# Patient Record
Sex: Female | Born: 1950 | Race: White | Hispanic: No | Smoking: Current every day smoker
Health system: Southern US, Community
[De-identification: ages and names within clinical notes are randomized; demographics above are authoritative.]

## PROBLEM LIST (undated history)

## (undated) DIAGNOSIS — R945 Abnormal results of liver function studies: Secondary | ICD-10-CM

## (undated) DIAGNOSIS — H3530 Unspecified macular degeneration: Secondary | ICD-10-CM

## (undated) DIAGNOSIS — R112 Nausea with vomiting, unspecified: Secondary | ICD-10-CM

## (undated) DIAGNOSIS — R7989 Other specified abnormal findings of blood chemistry: Secondary | ICD-10-CM

## (undated) DIAGNOSIS — I10 Essential (primary) hypertension: Secondary | ICD-10-CM

## (undated) DIAGNOSIS — Z9889 Other specified postprocedural states: Secondary | ICD-10-CM

## (undated) DIAGNOSIS — E039 Hypothyroidism, unspecified: Secondary | ICD-10-CM

## (undated) DIAGNOSIS — M1990 Unspecified osteoarthritis, unspecified site: Secondary | ICD-10-CM

## (undated) HISTORY — DX: Essential (primary) hypertension: I10

## (undated) HISTORY — PX: OTHER SURGICAL HISTORY: SHX169

## (undated) HISTORY — DX: Other specified abnormal findings of blood chemistry: R79.89

## (undated) HISTORY — DX: Unspecified osteoarthritis, unspecified site: M19.90

## (undated) HISTORY — PX: NECK SURGERY: SHX720

## (undated) HISTORY — PX: JOINT REPLACEMENT: SHX530

## (undated) HISTORY — DX: Unspecified macular degeneration: H35.30

## (undated) HISTORY — DX: Hypothyroidism, unspecified: E03.9

## (undated) HISTORY — DX: Abnormal results of liver function studies: R94.5

---

## 2002-11-06 ENCOUNTER — Encounter: Admission: RE | Admit: 2002-11-06 | Discharge: 2002-11-06 | Payer: Self-pay | Admitting: Rheumatology

## 2002-11-06 ENCOUNTER — Encounter: Payer: Self-pay | Admitting: Rheumatology

## 2003-04-06 ENCOUNTER — Encounter: Admission: RE | Admit: 2003-04-06 | Discharge: 2003-04-06 | Payer: Self-pay | Admitting: Rheumatology

## 2003-04-06 ENCOUNTER — Encounter: Payer: Self-pay | Admitting: Rheumatology

## 2010-12-21 ENCOUNTER — Inpatient Hospital Stay (HOSPITAL_COMMUNITY)
Admission: AD | Admit: 2010-12-21 | Discharge: 2010-12-22 | DRG: 125 | Disposition: A | Discharge status: Home / Self Care | Payer: BC Managed Care – PPO | Source: Other Acute Inpatient Hospital | Attending: Internal Medicine | Admitting: Internal Medicine

## 2010-12-21 DIAGNOSIS — M1990 Unspecified osteoarthritis, unspecified site: Secondary | ICD-10-CM | POA: Diagnosis present

## 2010-12-21 DIAGNOSIS — F17200 Nicotine dependence, unspecified, uncomplicated: Secondary | ICD-10-CM | POA: Diagnosis present

## 2010-12-21 DIAGNOSIS — Z96649 Presence of unspecified artificial hip joint: Secondary | ICD-10-CM

## 2010-12-21 DIAGNOSIS — R0789 Other chest pain: Principal | ICD-10-CM | POA: Diagnosis present

## 2010-12-21 DIAGNOSIS — E039 Hypothyroidism, unspecified: Secondary | ICD-10-CM | POA: Diagnosis present

## 2010-12-21 DIAGNOSIS — I10 Essential (primary) hypertension: Secondary | ICD-10-CM | POA: Diagnosis present

## 2010-12-21 DIAGNOSIS — K7689 Other specified diseases of liver: Secondary | ICD-10-CM | POA: Diagnosis present

## 2010-12-21 DIAGNOSIS — R079 Chest pain, unspecified: Secondary | ICD-10-CM

## 2010-12-21 LAB — CARDIAC PANEL(CRET KIN+CKTOT+MB+TROPI)
Relative Index: INVALID (ref 0.0–2.5)
Total CK: 58 U/L (ref 7–177)
Troponin I: 0.02 ng/mL (ref 0.00–0.06)

## 2010-12-21 LAB — CBC
HCT: 39.3 % (ref 36.0–46.0)
Hemoglobin: 12.9 g/dL (ref 12.0–15.0)
MCV: 92.5 fL (ref 78.0–100.0)
Platelets: 217 10*3/uL (ref 150–400)
RBC: 4.25 MIL/uL (ref 3.87–5.11)
WBC: 7.1 10*3/uL (ref 4.0–10.5)

## 2010-12-21 LAB — COMPREHENSIVE METABOLIC PANEL
ALT: 207 U/L — ABNORMAL HIGH (ref 0–35)
AST: 124 U/L — ABNORMAL HIGH (ref 0–37)
Albumin: 3.4 g/dL — ABNORMAL LOW (ref 3.5–5.2)
Alkaline Phosphatase: 143 U/L — ABNORMAL HIGH (ref 39–117)
Calcium: 9.3 mg/dL (ref 8.4–10.5)
Creatinine, Ser: 0.64 mg/dL (ref 0.4–1.2)
GFR calc Af Amer: >60 mL/min (ref >60)
GFR calc non Af Amer: >60 mL/min (ref >60)
Glucose, Bld: 97 mg/dL (ref 70–99)
Potassium: 3.7 mEq/L (ref 3.5–5.1)
Sodium: 136 mEq/L (ref 135–145)
Total Bilirubin: 0.2 mg/dL — ABNORMAL LOW (ref 0.3–1.2)
Total Protein: 6.5 g/dL (ref 6.0–8.3)

## 2010-12-21 LAB — PROTIME-INR
INR: 1.01 (ref 0.00–1.49)
Prothrombin Time: 13.5 seconds (ref 11.6–15.2)

## 2010-12-22 DIAGNOSIS — R079 Chest pain, unspecified: Secondary | ICD-10-CM

## 2010-12-24 NOTE — Cardiovascular Report (Signed)
NAME:  Krista Brown, Krista Brown NO.:  [id number]  MEDICAL RECORD NO.:  [id number]           PATIENT TYPE:  I  LOCATION:  6522                         FACILITY:  [facility]  PHYSICIAN:  Verne Carrow, MDDATE OF BIRTH:  July 16, 1951  DATE OF PROCEDURE:  12/22/2010 DATE OF DISCHARGE:  12/22/2010                           CARDIAC CATHETERIZATION   PRIMARY CARDIOLOGIST:  Jonelle Sidle, MD  PROCEDURES PERFORMED: 1. Left heart catheterization. 2. Selective coronary angiography. 3. Left ventricular angiogram.  OPERATOR:  Verne Carrow, MD  INDICATIONS:  This is a 60 year old Caucasian female with a history of tobacco abuse and family history of premature coronary artery disease who presented to [facility] with complaints of chest discomfort. Her cardiac enzymes were negative.  Her EKG showed nonspecific changes. She was transferred to [facility] for cardiac catheterization today.  DETAILS OF PROCEDURE:  The patient was brought to the Main Cardiac Catheterization Laboratory after signing informed consent for the procedure.  The right wrist was assessed with an [name] test which was positive.  The right wrist was prepped and draped in a sterile fashion. Lidocaine 1% was used for local anesthesia.  A 5-French sheath was inserted into the right radial artery without difficulty.  Verapamil 3 mg was given through the sheath after insertion.  The patient was given 3500 units of intravenous heparin after sheath insertion.  Standard diagnostic catheters were used to perform selective coronary angiography.  A pigtail catheter was used to perform a left ventricular angiogram.  The patient tolerated the procedure well and was taken to the recovery room in stable condition.  Of note, the sheath was removed here in the Cath Lab and a Terumo hemostasis band was applied over the arteriotomy site.  FINDINGS:  Central aortic pressure 153/83.  Left ventricular  pressure 148/14/19.  ANGIOGRAPHIC FINDINGS: 1. The native right coronary artery was a large dominant vessel with     no evidence of disease. 2. The left main artery was a normal vessel with no evidence of     disease. 3. There was moderate to large sized ramus intermediate branch with no     evidence of disease. 4. Circumflex artery gave off 2 very small-caliber obtuse marginal     branches and 2 moderate-sized obtuse marginal branches.  There was     no disease in this system. 5. The left anterior descending was a large vessel that coursed to the     apex and gave off 2 small-caliber diagonal branches.  There was no     disease in this system. 6. Left ventricular angiogram was performed in the RAO projection and     showed normal left ventricular systolic function with ejection     fraction of 60%.  IMPRESSION: 1. No angiographic evidence of coronary artery disease. 2. Normal left ventricular systolic function.  RECOMMENDATIONS:  No further cardiac workup at this time.  The patient will be watched closely for 3 hours and then will be discharged to home today.  Followup will be planned with Dr. Diona Browner in our [place] office. Tobacco cessation is strongly encouraged.  Verne Carrow, MD     CM/MEDQ  D:  12/22/2010  T:  12/22/2010  Job:  [id number]  cc:   Jonelle Sidle, MD  Electronically Signed by Verne Carrow MD on 12/24/2010 02:11:20 PM

## 2010-12-24 NOTE — Discharge Summary (Addendum)
NAME:  Krista Brown, Krista Brown NO.:  [id number]  MEDICAL RECORD NO.:  [id number]           PATIENT TYPE:  I  LOCATION:  6522                         FACILITY:  [facility]  PHYSICIAN:  Verne Carrow, MDDATE OF BIRTH:  02/11/1951  DATE OF ADMISSION:  12/21/2010 DATE OF DISCHARGE:  12/22/2010                              DISCHARGE SUMMARY   DISCHARGE DIAGNOSES: 1. Chest pain.     a.     Elevated D-dimer 0.9, negative pulmonary embolus by CT      angiogram.     b.     Normal coronary arteries by catheterization December 22, 2010,      with normal left ventricular function.     c.     Negative cardiac enzymes. 2. Hypertension, initiated on Norvasc. 3. Osteoarthritis. 4. Hypothyroidism, euthyroid with TSH 1.25. 5. Bilateral cataract surgery. 6. Neck surgery. 7. Right hip replacement in 2005. 8. Bilateral tubal ligation. 9. Transaminitis with alkaline phos 143, AST 124, ALT 207, for     outpatient followup.     a.     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  HOSPITAL COURSE:  Krista Brown is a very pleasant 60 year old female with a history of tobacco abuse, as well as diagnosed hypertension here in hospital.  Krista Brown has no prior cardiac history of diabetes or hyperlipidemia.  Krista Brown presented to [facility] with complaints of floating, which ultimately culminated into feeling of chest tightness, causing her to lose sleep.  Krista Brown had a feeling of orthopnea as well with the symptoms lasting in weaning.  Krista Brown was treated with nitro, oxygen, morphine ultimately with improvement in her symptoms and total relief. Krista Brown had a D-dimer of 0.9 at [facility].  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  Krista Brown was admitted for further observation.  Cardiac markers were normal with a pH level 0.3.  EKG showed sinus rhythm with left apical enlargement and low  voltage in limb leads with subsequent tracing showing poor anterior R-wave progression, and ST-T changes and T-wave inversions in the precordial leads.  The patient was initially seen and assessed by Dr. Diona Browner himself.  Krista Brown will need a cardiac catheterization to further define her anatomy.  Krista Brown was transferred to [facility] and underwent this procedure on December 22, 2010, by Dr. Clifton James who found her to have normal coronary arteries as well as normal LV function.  Please note here in the hospital Krista Brown also had elevated LFTs with alk phos of 143, AST 124, ALT of 207.  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  I discussed in depth this with her, and Krista Brown agrees to follow up with her primary care provider as an outpatient.  Dr. Clifton James has seen and examined her today and feels Krista Brown is stable for discharge.  DISCHARGE LABORATORY DATA:  LFTs as above.  Please see [place] results. WBC 7.1, hemoglobin 12.9, hematocrit 39.3, platelet count 217.  Sodium  136, potassium 2.7, chloride 100, CO2 of 31, glucose 97, BUN 7, creatinine 0.64.  Cardiac enzymes negative x1 year.  STUDIES:  Cardiac catheterization on December 22, 2010, please see full report for details.  No evidence of CAD.  EF 60%.  DISCHARGE MEDICATIONS: 1. Norvasc 5 mg daily.  This is a new medication for the patient.     After discussion with Dr. Clifton James, we have elected to start her on     this medicine for blood pressures ranging from mid upper 130s to     150s. 2. Faslodex OTC 1 capsule daily. 3. Calcium carbonate 1 tablet daily. 4. Synthroid 88 mcg every morning. 5. Vitamin B complex 1 tablet daily. 6. Vitamin C 1 tablet daily. 7. Vitamin D3 1 capsule daily.  Her Lovaza has been stopped until her LFT issue is clarified.  DISPOSITION:  Krista Brown will be discharged in stable condition to home. Krista Brown may return to work on Dec 25, 2010, and is not to lift  anything over 5 pounds for 1 weeks, participate in sexual activities for 1 week, or drive for 2 days.  Krista Brown is to follow a heart-healthy diet and to call or return if Krista Brown notices any pain, swelling, bleeding, or pus at her cath site.  Krista Brown will follow up with Dr. Diona Browner on Jan 08, 2011, at 8:40 a.m. for post cath check/posthospital visit.  Krista Brown is also instructed to follow up with PCP within 1 week plus her elevated liver function tests in the setting of hepatic cyst on CT angio.  Krista Brown is to stop Lovaza until Krista Brown sees her primary care doctor.  DURATION OF DISCHARGE ENCOUNTER:  Greater than 30 minutes including physician and PA time.     Ronie Spies, P.A.C.   ______________________________ Verne Carrow, MD    DD/MEDQ  D:  12/22/2010  T:  12/23/2010  Job:  [id number]  cc:   Jonelle Sidle, MD  Electronically Signed by Verne Carrow MD on 12/24/2010 02:11:22 PM Electronically Signed by Ronie Spies  on 12/30/2010 01:08:37 PM

## 2011-01-06 ENCOUNTER — Encounter: Payer: Self-pay | Admitting: *Deleted

## 2011-01-06 ENCOUNTER — Encounter: Payer: Self-pay | Admitting: Cardiology

## 2011-01-07 ENCOUNTER — Encounter: Payer: Self-pay | Admitting: Cardiology

## 2011-01-08 ENCOUNTER — Encounter: Payer: Self-pay | Admitting: Cardiology

## 2011-01-08 ENCOUNTER — Ambulatory Visit (INDEPENDENT_AMBULATORY_CARE_PROVIDER_SITE_OTHER): Payer: BC Managed Care – PPO | Admitting: Cardiology

## 2011-01-08 DIAGNOSIS — R079 Chest pain, unspecified: Secondary | ICD-10-CM

## 2011-01-08 DIAGNOSIS — K759 Inflammatory liver disease, unspecified: Secondary | ICD-10-CM

## 2011-01-08 DIAGNOSIS — I10 Essential (primary) hypertension: Secondary | ICD-10-CM

## 2011-01-08 NOTE — Assessment & Plan Note (Signed)
Recently placed on Norvasc. Continued monitoring and management, per Dr. Neita Carp.

## 2011-01-08 NOTE — Assessment & Plan Note (Signed)
Patient to followup with Dr. Neita Carp, for continued monitoring and management of recent elevated LFTs, on Omega-3 fatty acids. This apparently had been prescribed, approximately one year ago, for treatment of macular degeneration, by her ophthalmologist. Krista Brown has been placed on hold, and decision to resume will be deferred to Dr. Neita Carp, pending review of followup blood work.

## 2011-01-08 NOTE — Assessment & Plan Note (Signed)
No further cardiac workup is indicated, following recent normal coronary angiogram. Patient may return to Dr. Diona Browner, on an as needed basis.

## 2011-01-08 NOTE — Progress Notes (Signed)
Clinical Summary Krista Brown is a 60 y.o.female, who now presents for post catheterization followup.  Patient was initially seen here in the [facility] ED, by Dr. Diona Browner, per Dr. Dian Situ request, for evaluation of chest pain worrisome for unstable angina pectoris.  Recommendation was to transfer directly for coronary angiography, which was performed later that same day. Prior to transfer, patient had a CT angiogram of the chest, following an abnormal d-dimer, which was negative for pulmonary embolus.  A catheterization yielded normal coronary arteries and left ventricular function. Of note, she was found to have elevated liver function tests, and was advised to stop taking Lovaza. She has since been seen in followup by Dr. Neita Carp, and has remote history of hepatitis.   From a cardiac standpoint, she denies any recurrent CP, since her initial treatment here in the ED. She also reports no complications of her right wrist incision site.  No Known Allergies  Current outpatient prescriptions:amLODipine (NORVASC) 5 MG tablet, Take 5 mg by mouth daily.  , Disp: , Rfl: ;  b complex vitamins tablet, Take 1 tablet by mouth daily.  , Disp: , Rfl: ;  Bioflavonoid Products (BIOFLEX PO), Take 1 tablet by mouth daily.  , Disp: , Rfl: ;  Calcium Carbonate-Vitamin D (CALTRATE 600+D) 600-400 MG-UNIT per tablet, Take 1 tablet by mouth daily.  , Disp: , Rfl:  Cholecalciferol (D3 ADULT PO), Take 1 capsule by mouth 2 (two) times daily. , Disp: , Rfl: ;  fish oil-omega-3 fatty acids 1000 MG capsule, Take 1 g by mouth 2 (two) times daily.  , Disp: , Rfl: ;  levothyroxine (SYNTHROID, LEVOTHROID) 88 MCG tablet, Take 88 mcg by mouth daily.  , Disp: , Rfl: ;  Multiple Vitamins-Minerals (EYE VITAMINS PO), Take 1 capsule by mouth daily.  , Disp: , Rfl:  vitamin C (ASCORBIC ACID) 500 MG tablet, Take 500 mg by mouth daily.  , Disp: , Rfl:   Past Medical History  Diagnosis Date  . Essential hypertension, benign   . Hypothyroidism     . Osteoarthritis   . Abnormal liver function tests     Hepatic cyst  . Chest pain     Normal coronary arteries 4/12  . Macular degeneration     Social History Krista Brown reports that she has been smoking Cigarettes.  She has a 20 pack-year smoking history. She has never used smokeless tobacco. Krista Brown reports that she drinks alcohol.  Review of Systems  The patient denies fatigue, malaise, fever, weight gain/loss, vision loss, decreased hearing, hoarseness, chest pain, palpitations, shortness of breath, prolonged cough, wheezing, sleep apnea, coughing up blood, abdominal pain, blood in stool, nausea, vomiting, diarrhea, heartburn, incontinence, blood in urine, muscle weakness, joint pain, leg swelling, rash, skin lesions, headache, fainting, dizziness, depression, anxiety, enlarged lymph nodes, easy bruising or bleeding, and environmental allergies.      Physical Examination Filed Vitals:   01/08/11 0837  BP: 131/82  Pulse: 76   General: Well-developed, well-nourished in no distress head: Normocephalic and atraumatic eyes PERRLA/EOMI intact, conjunctiva and lids normal nose: No deformity or lesions mouth normal dentition, normal posterior pharynx neck: Supple, no JVD.  No masses, thyromegaly or abnormal cervical nodes lungs: Normal breath sounds bilaterally without wheezing.  Normal percussion heart: regular rate and rhythm with normal S1 and S2, no S3 or S4.  PMI is normal.  No pathological murmurs abdomen: Normal bowel sounds, abdomen is soft and nontender without masses, organomegaly or hernias noted.  No hepatosplenomegaly musculoskeletal: Back  normal, normal gait muscle strength and tone normal pulsus: Pulse is normal in all 4 extremities Extremities: Stable right wrist incision site; palpable RP, with no hematoma, ecchymosis; no peripheral pitting edema neurologic: Alert and oriented x 3 skin: Intact without lesions or rashes cervical nodes: No significant  adenopathy psychologic: Normal affect   ECG   Studies   Problem List and Plan

## 2016-08-03 DIAGNOSIS — E039 Hypothyroidism, unspecified: Secondary | ICD-10-CM | POA: Diagnosis not present

## 2016-08-03 DIAGNOSIS — I10 Essential (primary) hypertension: Secondary | ICD-10-CM | POA: Diagnosis not present

## 2016-08-03 DIAGNOSIS — R69 Illness, unspecified: Secondary | ICD-10-CM | POA: Diagnosis not present

## 2016-08-03 DIAGNOSIS — E7800 Pure hypercholesterolemia, unspecified: Secondary | ICD-10-CM | POA: Diagnosis not present

## 2016-08-03 DIAGNOSIS — M19041 Primary osteoarthritis, right hand: Secondary | ICD-10-CM | POA: Diagnosis not present

## 2016-08-06 DIAGNOSIS — M1612 Unilateral primary osteoarthritis, left hip: Secondary | ICD-10-CM | POA: Diagnosis not present

## 2016-08-06 DIAGNOSIS — I10 Essential (primary) hypertension: Secondary | ICD-10-CM | POA: Diagnosis not present

## 2016-08-06 DIAGNOSIS — R69 Illness, unspecified: Secondary | ICD-10-CM | POA: Diagnosis not present

## 2016-08-06 DIAGNOSIS — M19041 Primary osteoarthritis, right hand: Secondary | ICD-10-CM | POA: Diagnosis not present

## 2016-08-06 DIAGNOSIS — E039 Hypothyroidism, unspecified: Secondary | ICD-10-CM | POA: Diagnosis not present

## 2016-09-15 DIAGNOSIS — H52223 Regular astigmatism, bilateral: Secondary | ICD-10-CM | POA: Diagnosis not present

## 2016-09-15 DIAGNOSIS — H353131 Nonexudative age-related macular degeneration, bilateral, early dry stage: Secondary | ICD-10-CM | POA: Diagnosis not present

## 2016-11-14 DIAGNOSIS — I10 Essential (primary) hypertension: Secondary | ICD-10-CM | POA: Diagnosis not present

## 2016-11-14 DIAGNOSIS — M1990 Unspecified osteoarthritis, unspecified site: Secondary | ICD-10-CM | POA: Diagnosis not present

## 2016-11-14 DIAGNOSIS — Z79899 Other long term (current) drug therapy: Secondary | ICD-10-CM | POA: Diagnosis not present

## 2016-11-14 DIAGNOSIS — M5431 Sciatica, right side: Secondary | ICD-10-CM | POA: Diagnosis not present

## 2016-11-14 DIAGNOSIS — Z96641 Presence of right artificial hip joint: Secondary | ICD-10-CM | POA: Diagnosis not present

## 2016-11-14 DIAGNOSIS — M25551 Pain in right hip: Secondary | ICD-10-CM | POA: Diagnosis not present

## 2016-12-16 DIAGNOSIS — R69 Illness, unspecified: Secondary | ICD-10-CM | POA: Diagnosis not present

## 2017-01-29 DIAGNOSIS — R69 Illness, unspecified: Secondary | ICD-10-CM | POA: Diagnosis not present

## 2017-01-29 DIAGNOSIS — I10 Essential (primary) hypertension: Secondary | ICD-10-CM | POA: Diagnosis not present

## 2017-01-29 DIAGNOSIS — E7800 Pure hypercholesterolemia, unspecified: Secondary | ICD-10-CM | POA: Diagnosis not present

## 2017-01-29 DIAGNOSIS — E039 Hypothyroidism, unspecified: Secondary | ICD-10-CM | POA: Diagnosis not present

## 2017-02-02 DIAGNOSIS — E039 Hypothyroidism, unspecified: Secondary | ICD-10-CM | POA: Diagnosis not present

## 2017-02-02 DIAGNOSIS — M1612 Unilateral primary osteoarthritis, left hip: Secondary | ICD-10-CM | POA: Diagnosis not present

## 2017-02-02 DIAGNOSIS — E7800 Pure hypercholesterolemia, unspecified: Secondary | ICD-10-CM | POA: Diagnosis not present

## 2017-02-02 DIAGNOSIS — F411 Generalized anxiety disorder: Secondary | ICD-10-CM | POA: Diagnosis not present

## 2017-02-02 DIAGNOSIS — Z1389 Encounter for screening for other disorder: Secondary | ICD-10-CM | POA: Diagnosis not present

## 2017-02-02 DIAGNOSIS — M19041 Primary osteoarthritis, right hand: Secondary | ICD-10-CM | POA: Diagnosis not present

## 2017-02-02 DIAGNOSIS — I10 Essential (primary) hypertension: Secondary | ICD-10-CM | POA: Diagnosis not present

## 2017-02-02 DIAGNOSIS — R69 Illness, unspecified: Secondary | ICD-10-CM | POA: Diagnosis not present

## 2017-03-17 DIAGNOSIS — Z1231 Encounter for screening mammogram for malignant neoplasm of breast: Secondary | ICD-10-CM | POA: Diagnosis not present

## 2017-04-06 DIAGNOSIS — Z6827 Body mass index (BMI) 27.0-27.9, adult: Secondary | ICD-10-CM | POA: Diagnosis not present

## 2017-04-06 DIAGNOSIS — Z01419 Encounter for gynecological examination (general) (routine) without abnormal findings: Secondary | ICD-10-CM | POA: Diagnosis not present

## 2017-07-30 DIAGNOSIS — R69 Illness, unspecified: Secondary | ICD-10-CM | POA: Diagnosis not present

## 2017-07-30 DIAGNOSIS — E039 Hypothyroidism, unspecified: Secondary | ICD-10-CM | POA: Diagnosis not present

## 2017-07-30 DIAGNOSIS — I10 Essential (primary) hypertension: Secondary | ICD-10-CM | POA: Diagnosis not present

## 2017-07-30 DIAGNOSIS — E7800 Pure hypercholesterolemia, unspecified: Secondary | ICD-10-CM | POA: Diagnosis not present

## 2017-08-11 DIAGNOSIS — I10 Essential (primary) hypertension: Secondary | ICD-10-CM | POA: Diagnosis not present

## 2017-08-11 DIAGNOSIS — R69 Illness, unspecified: Secondary | ICD-10-CM | POA: Diagnosis not present

## 2017-08-11 DIAGNOSIS — M19041 Primary osteoarthritis, right hand: Secondary | ICD-10-CM | POA: Diagnosis not present

## 2017-08-11 DIAGNOSIS — Z23 Encounter for immunization: Secondary | ICD-10-CM | POA: Diagnosis not present

## 2017-08-11 DIAGNOSIS — E039 Hypothyroidism, unspecified: Secondary | ICD-10-CM | POA: Diagnosis not present

## 2017-08-11 DIAGNOSIS — E7800 Pure hypercholesterolemia, unspecified: Secondary | ICD-10-CM | POA: Diagnosis not present

## 2017-08-11 DIAGNOSIS — M1612 Unilateral primary osteoarthritis, left hip: Secondary | ICD-10-CM | POA: Diagnosis not present

## 2017-08-11 DIAGNOSIS — Z0000 Encounter for general adult medical examination without abnormal findings: Secondary | ICD-10-CM | POA: Diagnosis not present

## 2017-11-01 DIAGNOSIS — I10 Essential (primary) hypertension: Secondary | ICD-10-CM | POA: Diagnosis not present

## 2017-11-01 DIAGNOSIS — Z0100 Encounter for examination of eyes and vision without abnormal findings: Secondary | ICD-10-CM | POA: Diagnosis not present

## 2018-02-04 DIAGNOSIS — I10 Essential (primary) hypertension: Secondary | ICD-10-CM | POA: Diagnosis not present

## 2018-02-04 DIAGNOSIS — E039 Hypothyroidism, unspecified: Secondary | ICD-10-CM | POA: Diagnosis not present

## 2018-02-04 DIAGNOSIS — R69 Illness, unspecified: Secondary | ICD-10-CM | POA: Diagnosis not present

## 2018-02-04 DIAGNOSIS — M19041 Primary osteoarthritis, right hand: Secondary | ICD-10-CM | POA: Diagnosis not present

## 2018-02-04 DIAGNOSIS — E7800 Pure hypercholesterolemia, unspecified: Secondary | ICD-10-CM | POA: Diagnosis not present

## 2018-02-07 DIAGNOSIS — E7800 Pure hypercholesterolemia, unspecified: Secondary | ICD-10-CM | POA: Diagnosis not present

## 2018-02-07 DIAGNOSIS — M1612 Unilateral primary osteoarthritis, left hip: Secondary | ICD-10-CM | POA: Diagnosis not present

## 2018-02-07 DIAGNOSIS — M19041 Primary osteoarthritis, right hand: Secondary | ICD-10-CM | POA: Diagnosis not present

## 2018-02-07 DIAGNOSIS — E039 Hypothyroidism, unspecified: Secondary | ICD-10-CM | POA: Diagnosis not present

## 2018-02-07 DIAGNOSIS — Z6826 Body mass index (BMI) 26.0-26.9, adult: Secondary | ICD-10-CM | POA: Diagnosis not present

## 2018-02-07 DIAGNOSIS — I10 Essential (primary) hypertension: Secondary | ICD-10-CM | POA: Diagnosis not present

## 2018-02-07 DIAGNOSIS — J069 Acute upper respiratory infection, unspecified: Secondary | ICD-10-CM | POA: Diagnosis not present

## 2018-07-05 DIAGNOSIS — M25552 Pain in left hip: Secondary | ICD-10-CM | POA: Diagnosis not present

## 2018-07-05 DIAGNOSIS — E663 Overweight: Secondary | ICD-10-CM | POA: Diagnosis not present

## 2018-07-05 DIAGNOSIS — Z299 Encounter for prophylactic measures, unspecified: Secondary | ICD-10-CM | POA: Diagnosis not present

## 2018-07-05 DIAGNOSIS — M1612 Unilateral primary osteoarthritis, left hip: Secondary | ICD-10-CM | POA: Diagnosis not present

## 2018-07-05 DIAGNOSIS — M461 Sacroiliitis, not elsewhere classified: Secondary | ICD-10-CM | POA: Diagnosis not present

## 2018-07-05 DIAGNOSIS — M170 Bilateral primary osteoarthritis of knee: Secondary | ICD-10-CM | POA: Diagnosis not present

## 2018-07-14 DIAGNOSIS — M1612 Unilateral primary osteoarthritis, left hip: Secondary | ICD-10-CM | POA: Diagnosis not present

## 2018-07-14 DIAGNOSIS — M25562 Pain in left knee: Secondary | ICD-10-CM | POA: Diagnosis not present

## 2018-07-14 DIAGNOSIS — M25561 Pain in right knee: Secondary | ICD-10-CM | POA: Diagnosis not present

## 2018-07-14 DIAGNOSIS — M25552 Pain in left hip: Secondary | ICD-10-CM | POA: Diagnosis not present

## 2018-07-26 DIAGNOSIS — E7800 Pure hypercholesterolemia, unspecified: Secondary | ICD-10-CM | POA: Diagnosis not present

## 2018-07-26 DIAGNOSIS — R69 Illness, unspecified: Secondary | ICD-10-CM | POA: Diagnosis not present

## 2018-07-26 DIAGNOSIS — E039 Hypothyroidism, unspecified: Secondary | ICD-10-CM | POA: Diagnosis not present

## 2018-07-26 DIAGNOSIS — I10 Essential (primary) hypertension: Secondary | ICD-10-CM | POA: Diagnosis not present

## 2018-07-29 DIAGNOSIS — Z6826 Body mass index (BMI) 26.0-26.9, adult: Secondary | ICD-10-CM | POA: Diagnosis not present

## 2018-07-29 DIAGNOSIS — J069 Acute upper respiratory infection, unspecified: Secondary | ICD-10-CM | POA: Diagnosis not present

## 2018-07-29 DIAGNOSIS — E782 Mixed hyperlipidemia: Secondary | ICD-10-CM | POA: Diagnosis not present

## 2018-07-29 DIAGNOSIS — Z23 Encounter for immunization: Secondary | ICD-10-CM | POA: Diagnosis not present

## 2018-07-29 DIAGNOSIS — E7800 Pure hypercholesterolemia, unspecified: Secondary | ICD-10-CM | POA: Diagnosis not present

## 2018-07-29 DIAGNOSIS — E039 Hypothyroidism, unspecified: Secondary | ICD-10-CM | POA: Diagnosis not present

## 2018-07-29 DIAGNOSIS — I10 Essential (primary) hypertension: Secondary | ICD-10-CM | POA: Diagnosis not present

## 2018-10-07 DIAGNOSIS — M1612 Unilateral primary osteoarthritis, left hip: Secondary | ICD-10-CM | POA: Diagnosis not present

## 2018-10-07 DIAGNOSIS — M170 Bilateral primary osteoarthritis of knee: Secondary | ICD-10-CM | POA: Diagnosis not present

## 2018-10-07 DIAGNOSIS — Z6824 Body mass index (BMI) 24.0-24.9, adult: Secondary | ICD-10-CM | POA: Diagnosis not present

## 2018-10-07 DIAGNOSIS — I10 Essential (primary) hypertension: Secondary | ICD-10-CM | POA: Diagnosis not present

## 2018-10-07 DIAGNOSIS — M25552 Pain in left hip: Secondary | ICD-10-CM | POA: Diagnosis not present

## 2018-10-07 DIAGNOSIS — Z299 Encounter for prophylactic measures, unspecified: Secondary | ICD-10-CM | POA: Diagnosis not present

## 2018-10-07 DIAGNOSIS — R269 Unspecified abnormalities of gait and mobility: Secondary | ICD-10-CM | POA: Diagnosis not present

## 2018-10-11 DIAGNOSIS — M25652 Stiffness of left hip, not elsewhere classified: Secondary | ICD-10-CM | POA: Diagnosis not present

## 2018-10-11 DIAGNOSIS — M1612 Unilateral primary osteoarthritis, left hip: Secondary | ICD-10-CM | POA: Diagnosis not present

## 2018-10-11 DIAGNOSIS — R531 Weakness: Secondary | ICD-10-CM | POA: Diagnosis not present

## 2018-10-11 DIAGNOSIS — M25552 Pain in left hip: Secondary | ICD-10-CM | POA: Diagnosis not present

## 2018-10-12 DIAGNOSIS — M25652 Stiffness of left hip, not elsewhere classified: Secondary | ICD-10-CM | POA: Diagnosis not present

## 2018-10-12 DIAGNOSIS — M1612 Unilateral primary osteoarthritis, left hip: Secondary | ICD-10-CM | POA: Diagnosis not present

## 2018-10-12 DIAGNOSIS — M25552 Pain in left hip: Secondary | ICD-10-CM | POA: Diagnosis not present

## 2018-10-12 DIAGNOSIS — R531 Weakness: Secondary | ICD-10-CM | POA: Diagnosis not present

## 2018-10-17 DIAGNOSIS — M25552 Pain in left hip: Secondary | ICD-10-CM | POA: Diagnosis not present

## 2018-10-17 DIAGNOSIS — M25652 Stiffness of left hip, not elsewhere classified: Secondary | ICD-10-CM | POA: Diagnosis not present

## 2018-10-17 DIAGNOSIS — R531 Weakness: Secondary | ICD-10-CM | POA: Diagnosis not present

## 2018-10-17 DIAGNOSIS — M1612 Unilateral primary osteoarthritis, left hip: Secondary | ICD-10-CM | POA: Diagnosis not present

## 2018-10-19 DIAGNOSIS — R531 Weakness: Secondary | ICD-10-CM | POA: Diagnosis not present

## 2018-10-19 DIAGNOSIS — M1612 Unilateral primary osteoarthritis, left hip: Secondary | ICD-10-CM | POA: Diagnosis not present

## 2018-10-19 DIAGNOSIS — M25552 Pain in left hip: Secondary | ICD-10-CM | POA: Diagnosis not present

## 2018-10-19 DIAGNOSIS — M25652 Stiffness of left hip, not elsewhere classified: Secondary | ICD-10-CM | POA: Diagnosis not present

## 2018-10-21 DIAGNOSIS — M1612 Unilateral primary osteoarthritis, left hip: Secondary | ICD-10-CM | POA: Diagnosis not present

## 2018-10-25 DIAGNOSIS — M25552 Pain in left hip: Secondary | ICD-10-CM | POA: Diagnosis not present

## 2018-10-25 DIAGNOSIS — M25652 Stiffness of left hip, not elsewhere classified: Secondary | ICD-10-CM | POA: Diagnosis not present

## 2018-10-25 DIAGNOSIS — M1612 Unilateral primary osteoarthritis, left hip: Secondary | ICD-10-CM | POA: Diagnosis not present

## 2018-10-25 DIAGNOSIS — R531 Weakness: Secondary | ICD-10-CM | POA: Diagnosis not present

## 2018-10-27 DIAGNOSIS — M25552 Pain in left hip: Secondary | ICD-10-CM | POA: Diagnosis not present

## 2018-10-27 DIAGNOSIS — M1612 Unilateral primary osteoarthritis, left hip: Secondary | ICD-10-CM | POA: Diagnosis not present

## 2018-10-27 DIAGNOSIS — M25652 Stiffness of left hip, not elsewhere classified: Secondary | ICD-10-CM | POA: Diagnosis not present

## 2018-10-27 DIAGNOSIS — R531 Weakness: Secondary | ICD-10-CM | POA: Diagnosis not present

## 2018-10-31 DIAGNOSIS — M1612 Unilateral primary osteoarthritis, left hip: Secondary | ICD-10-CM | POA: Diagnosis not present

## 2018-10-31 DIAGNOSIS — M25652 Stiffness of left hip, not elsewhere classified: Secondary | ICD-10-CM | POA: Diagnosis not present

## 2018-10-31 DIAGNOSIS — M25552 Pain in left hip: Secondary | ICD-10-CM | POA: Diagnosis not present

## 2018-10-31 DIAGNOSIS — R531 Weakness: Secondary | ICD-10-CM | POA: Diagnosis not present

## 2018-11-03 DIAGNOSIS — M1612 Unilateral primary osteoarthritis, left hip: Secondary | ICD-10-CM | POA: Diagnosis not present

## 2018-11-03 DIAGNOSIS — M25552 Pain in left hip: Secondary | ICD-10-CM | POA: Diagnosis not present

## 2018-11-03 DIAGNOSIS — R531 Weakness: Secondary | ICD-10-CM | POA: Diagnosis not present

## 2018-11-03 DIAGNOSIS — M25652 Stiffness of left hip, not elsewhere classified: Secondary | ICD-10-CM | POA: Diagnosis not present

## 2018-11-10 DIAGNOSIS — M25652 Stiffness of left hip, not elsewhere classified: Secondary | ICD-10-CM | POA: Diagnosis not present

## 2018-11-10 DIAGNOSIS — M1612 Unilateral primary osteoarthritis, left hip: Secondary | ICD-10-CM | POA: Diagnosis not present

## 2018-11-10 DIAGNOSIS — R531 Weakness: Secondary | ICD-10-CM | POA: Diagnosis not present

## 2018-11-10 DIAGNOSIS — M25552 Pain in left hip: Secondary | ICD-10-CM | POA: Diagnosis not present

## 2018-11-14 DIAGNOSIS — R531 Weakness: Secondary | ICD-10-CM | POA: Diagnosis not present

## 2018-11-14 DIAGNOSIS — M1612 Unilateral primary osteoarthritis, left hip: Secondary | ICD-10-CM | POA: Diagnosis not present

## 2018-11-14 DIAGNOSIS — M25652 Stiffness of left hip, not elsewhere classified: Secondary | ICD-10-CM | POA: Diagnosis not present

## 2018-11-14 DIAGNOSIS — M25552 Pain in left hip: Secondary | ICD-10-CM | POA: Diagnosis not present

## 2018-11-17 DIAGNOSIS — M25552 Pain in left hip: Secondary | ICD-10-CM | POA: Diagnosis not present

## 2018-11-17 DIAGNOSIS — R531 Weakness: Secondary | ICD-10-CM | POA: Diagnosis not present

## 2018-11-17 DIAGNOSIS — M1612 Unilateral primary osteoarthritis, left hip: Secondary | ICD-10-CM | POA: Diagnosis not present

## 2018-11-17 DIAGNOSIS — M25652 Stiffness of left hip, not elsewhere classified: Secondary | ICD-10-CM | POA: Diagnosis not present

## 2018-11-23 DIAGNOSIS — M25552 Pain in left hip: Secondary | ICD-10-CM | POA: Diagnosis not present

## 2018-11-23 DIAGNOSIS — M1612 Unilateral primary osteoarthritis, left hip: Secondary | ICD-10-CM | POA: Diagnosis not present

## 2018-11-23 DIAGNOSIS — M25652 Stiffness of left hip, not elsewhere classified: Secondary | ICD-10-CM | POA: Diagnosis not present

## 2018-11-23 DIAGNOSIS — R531 Weakness: Secondary | ICD-10-CM | POA: Diagnosis not present

## 2018-12-08 DIAGNOSIS — M25552 Pain in left hip: Secondary | ICD-10-CM | POA: Diagnosis not present

## 2018-12-08 DIAGNOSIS — M1612 Unilateral primary osteoarthritis, left hip: Secondary | ICD-10-CM | POA: Diagnosis not present

## 2018-12-08 DIAGNOSIS — R531 Weakness: Secondary | ICD-10-CM | POA: Diagnosis not present

## 2018-12-08 DIAGNOSIS — M25652 Stiffness of left hip, not elsewhere classified: Secondary | ICD-10-CM | POA: Diagnosis not present

## 2018-12-21 DIAGNOSIS — M1612 Unilateral primary osteoarthritis, left hip: Secondary | ICD-10-CM | POA: Diagnosis not present

## 2018-12-21 DIAGNOSIS — M25652 Stiffness of left hip, not elsewhere classified: Secondary | ICD-10-CM | POA: Diagnosis not present

## 2018-12-21 DIAGNOSIS — R531 Weakness: Secondary | ICD-10-CM | POA: Diagnosis not present

## 2018-12-21 DIAGNOSIS — M25552 Pain in left hip: Secondary | ICD-10-CM | POA: Diagnosis not present

## 2019-01-23 DIAGNOSIS — E782 Mixed hyperlipidemia: Secondary | ICD-10-CM | POA: Diagnosis not present

## 2019-01-23 DIAGNOSIS — E7800 Pure hypercholesterolemia, unspecified: Secondary | ICD-10-CM | POA: Diagnosis not present

## 2019-01-23 DIAGNOSIS — I10 Essential (primary) hypertension: Secondary | ICD-10-CM | POA: Diagnosis not present

## 2019-01-23 DIAGNOSIS — R69 Illness, unspecified: Secondary | ICD-10-CM | POA: Diagnosis not present

## 2019-01-23 DIAGNOSIS — M19041 Primary osteoarthritis, right hand: Secondary | ICD-10-CM | POA: Diagnosis not present

## 2019-01-23 DIAGNOSIS — E039 Hypothyroidism, unspecified: Secondary | ICD-10-CM | POA: Diagnosis not present

## 2019-01-26 DIAGNOSIS — E039 Hypothyroidism, unspecified: Secondary | ICD-10-CM | POA: Diagnosis not present

## 2019-01-26 DIAGNOSIS — E7800 Pure hypercholesterolemia, unspecified: Secondary | ICD-10-CM | POA: Diagnosis not present

## 2019-01-26 DIAGNOSIS — R69 Illness, unspecified: Secondary | ICD-10-CM | POA: Diagnosis not present

## 2019-01-26 DIAGNOSIS — M19041 Primary osteoarthritis, right hand: Secondary | ICD-10-CM | POA: Diagnosis not present

## 2019-01-26 DIAGNOSIS — I10 Essential (primary) hypertension: Secondary | ICD-10-CM | POA: Diagnosis not present

## 2019-01-26 DIAGNOSIS — Z6825 Body mass index (BMI) 25.0-25.9, adult: Secondary | ICD-10-CM | POA: Diagnosis not present

## 2019-01-26 DIAGNOSIS — E782 Mixed hyperlipidemia: Secondary | ICD-10-CM | POA: Diagnosis not present

## 2019-02-10 DIAGNOSIS — M1612 Unilateral primary osteoarthritis, left hip: Secondary | ICD-10-CM | POA: Diagnosis not present

## 2019-02-17 DIAGNOSIS — M169 Osteoarthritis of hip, unspecified: Secondary | ICD-10-CM | POA: Diagnosis not present

## 2019-02-17 DIAGNOSIS — I10 Essential (primary) hypertension: Secondary | ICD-10-CM | POA: Diagnosis not present

## 2019-02-17 DIAGNOSIS — E039 Hypothyroidism, unspecified: Secondary | ICD-10-CM | POA: Diagnosis not present

## 2019-02-17 DIAGNOSIS — Z6825 Body mass index (BMI) 25.0-25.9, adult: Secondary | ICD-10-CM | POA: Diagnosis not present

## 2019-03-07 ENCOUNTER — Encounter: Payer: Self-pay | Admitting: *Deleted

## 2019-03-08 ENCOUNTER — Encounter: Payer: Self-pay | Admitting: *Deleted

## 2019-03-08 ENCOUNTER — Ambulatory Visit (INDEPENDENT_AMBULATORY_CARE_PROVIDER_SITE_OTHER): Payer: Medicare HMO | Admitting: Cardiology

## 2019-03-08 ENCOUNTER — Other Ambulatory Visit: Payer: Self-pay

## 2019-03-08 ENCOUNTER — Telehealth: Payer: Self-pay | Admitting: Cardiovascular Disease

## 2019-03-08 ENCOUNTER — Encounter: Payer: Self-pay | Admitting: Cardiology

## 2019-03-08 VITALS — BP 132/70 | HR 74 | Temp 97.5°F | Ht 59.0 in | Wt 128.2 lb

## 2019-03-08 DIAGNOSIS — Z87898 Personal history of other specified conditions: Secondary | ICD-10-CM | POA: Diagnosis not present

## 2019-03-08 DIAGNOSIS — I10 Essential (primary) hypertension: Secondary | ICD-10-CM

## 2019-03-08 DIAGNOSIS — Z01818 Encounter for other preprocedural examination: Secondary | ICD-10-CM

## 2019-03-08 NOTE — Patient Instructions (Signed)
Your physician recommends that you schedule a follow-up appointment in: AS NEEDED WITH DR BRANCH  Your physician recommends that you continue on your current medications as directed. Please refer to the Current Medication list given to you today.  Thank you for choosing [facility]!!    

## 2019-03-08 NOTE — Progress Notes (Signed)
Clinical Summary Ms. Conyer is a 68 y.o.female seen as new patient for preoperative evaluation   1. History of chest pain - admit 12/2010 with chest pain and abnormal EKG. CT PE was negative.  - she had a cath 11/2010 with normal coronaries  -no recent chest pain - no SOB or DOE.  - does housework regularly (sweeping,laundry) without cardiopulmonary symptoms. Can be limited by her chronic hip pain - can walk up a flight of stairs with assistance of railing.  - stationary bike without troubles.     2. HTN - compliant with meds  3. Preoperative evaluation - being considered for hip replacement.       SH: mother is Kennon Portela, also patient of mine Past Medical History:  Diagnosis Date  . Abnormal liver function tests    Hepatic cyst  . Chest pain    Normal coronary arteries 4/12  . Essential hypertension, benign   . Hypothyroidism   . Macular degeneration   . Osteoarthritis      No Known Allergies   Current Outpatient Medications  Medication Sig Dispense Refill  . amLODipine (NORVASC) 5 MG tablet Take 5 mg by mouth daily.      Marland Kitchen b complex vitamins tablet Take 1 tablet by mouth daily.      Marland Kitchen Bioflavonoid Products (BIOFLEX PO) Take 1 tablet by mouth daily.      . Calcium Carbonate-Vitamin D (CALTRATE 600+D) 600-400 MG-UNIT per tablet Take 1 tablet by mouth daily.      . Cholecalciferol (D3 ADULT PO) Take 1 capsule by mouth 2 (two) times daily.     . fish oil-omega-3 fatty acids 1000 MG capsule Take 1 g by mouth 2 (two) times daily.      Marland Kitchen levothyroxine (SYNTHROID, LEVOTHROID) 88 MCG tablet Take 88 mcg by mouth daily.      . Multiple Vitamins-Minerals (EYE VITAMINS PO) Take 1 capsule by mouth daily.      . vitamin C (ASCORBIC ACID) 500 MG tablet Take 500 mg by mouth daily.       No current facility-administered medications for this visit.        No Known Allergies    Family History  Problem Relation Age of Onset  . Stroke Father        Age 26  .  Hypertension Mother   . Diabetes type II Mother      Social History Ms. Reppucci reports that she has been smoking cigarettes. She has a 20.00 pack-year smoking history. She has never used smokeless tobacco. Ms. Zappia reports current alcohol use.   Review of Systems CONSTITUTIONAL: No weight loss, fever, chills, weakness or fatigue.  HEENT: Eyes: No visual loss, blurred vision, double vision or yellow sclerae.No hearing loss, sneezing, congestion, runny nose or sore throat.  SKIN: No rash or itching.  CARDIOVASCULAR: per hpi RESPIRATORY: No shortness of breath, cough or sputum.  GASTROINTESTINAL: No anorexia, nausea, vomiting or diarrhea. No abdominal pain or blood.  GENITOURINARY: No burning on urination, no polyuria NEUROLOGICAL: No headache, dizziness, syncope, paralysis, ataxia, numbness or tingling in the extremities. No change in bowel or bladder control.  MUSCULOSKELETAL: left hip pain LYMPHATICS: No enlarged nodes. No history of splenectomy.  PSYCHIATRIC: No history of depression or anxiety.  ENDOCRINOLOGIC: No reports of sweating, cold or heat intolerance. No polyuria or polydipsia.  Marland Kitchen   Physical Examination Today's Vitals   03/08/19 1023  BP: 132/70  Pulse: 74  Temp: (!) 97.5 F (36.4  C)  SpO2: 97%  Weight: 128 lb 3.2 oz (58.2 kg)  Height: 4\' 11"  (1.499 m)   Body mass index is 25.89 kg/m.  Gen: resting comfortably, no acute distress HEENT: no scleral icterus, pupils equal round and reactive, no palptable cervical adenopathy,  CV: RRR, no m/rg, no vjd Resp: Clear to auscultation bilaterally GI: abdomen is soft, non-tender, non-distended, normal bowel sounds, no hepatosplenomegaly MSK: extremities are warm, no edema.  Skin: warm, no rash Neuro:  no focal deficits Psych: appropriate affect   Diagnostic Studies 11/2010 cath FINDINGS:  Central aortic pressure 153/83.  Left ventricular pressure 148/14/19.  ANGIOGRAPHIC FINDINGS: 1. The native right coronary  artery was a large dominant vessel with     no evidence of disease. 2. The left main artery was a normal vessel with no evidence of     disease. 3. There was moderate to large sized ramus intermediate Desa Rech with no     evidence of disease. 4. Circumflex artery gave off 2 very small-caliber obtuse marginal     branches and 2 moderate-sized obtuse marginal branches.  There was     no disease in this system. 5. The left anterior descending was a large vessel that coursed to the     apex and gave off 2 small-caliber diagonal branches.  There was no     disease in this system. 6. Left ventricular angiogram was performed in the RAO projection and     showed normal left ventricular systolic function with ejection     fraction of 60%.  POST CATH IMPRESSION: 1. No angiographic evidence of coronary artery disease. 2. Normal left ventricular systolic function.  RECOMMENDATIONS:  No further cardiac workup at this time.  The patient will be watched closely for 3 hours and then will be discharged to home today.  Followup will be planned with Dr. Diona BrownerMcDowell in our [place] office. Tobacco cessation is strongly encouraged.    Assessment and Plan  1. History of chest pain - no recent symptoms. Prior cath 2012 without significant disease - continue to monitor - EKG today without acute ischemic changes  2. HTN - at goal, continue current meds  3. Preoperative evaluation - limited by hip but essentially tolerates greater than 4METs without significant cardiopulmonary symptoms - recommend proceeding with surgery as planned.     F/u as needed   Antoine PocheJonathan F. Elasha Tess, M.D.

## 2019-03-08 NOTE — Telephone Encounter (Signed)

## 2019-03-23 ENCOUNTER — Ambulatory Visit: Payer: Self-pay | Admitting: Physician Assistant

## 2019-03-23 DIAGNOSIS — M1612 Unilateral primary osteoarthritis, left hip: Secondary | ICD-10-CM | POA: Diagnosis not present

## 2019-03-23 NOTE — H&P (View-Only) (Signed)
TOTAL HIP ADMISSION H&P  Patient is admitted for left total hip arthroplasty.  Subjective:  Chief Complaint: left hip pain  HPI: Krista Brown, 68 y.o. female, has a history of pain and functional disability in the left hip(s) due to arthritis and patient has failed non-surgical conservative treatments for greater than 12 weeks to include NSAID's and/or analgesics, corticosteriod injections, use of assistive devices and activity modification.  Onset of symptoms was gradual starting 8 years ago with gradually worsening course since that time.The patient noted no past surgery on the left hip(s).  Patient currently rates pain in the left hip at 8 out of 10 with activity. Patient has night pain, worsening of pain with activity and weight bearing, trendelenberg gait, pain that interfers with activities of daily living and pain with passive range of motion. Patient has evidence of subchondral cysts, periarticular osteophytes and joint space narrowing by imaging studies. This condition presents safety issues increasing the risk of falls.  There is no current active infection.  Patient Active Problem List   Diagnosis Date Noted  . Chest pain 01/08/2011  . Unspecified essential hypertension 01/08/2011  . Hepatitis 01/08/2011   Past Medical History:  Diagnosis Date  . Abnormal liver function tests    Hepatic cyst  . Chest pain    Normal coronary arteries 4/12  . Essential hypertension, benign   . Hypothyroidism   . Macular degeneration   . Osteoarthritis       Current Outpatient Medications  Medication Sig Dispense Refill Last Dose  . acetaminophen (TYLENOL) 500 MG tablet Take 1,500 mg by mouth 2 (two) times daily as needed for moderate pain or headache.     Marland Kitchen amLODipine (NORVASC) 10 MG tablet Take 10 mg by mouth daily.   Taking  . Cholecalciferol (VITAMIN D3) 25 MCG (1000 UT) CAPS Take 1,000 Units by mouth daily.     . clonazePAM (KLONOPIN) 0.5 MG tablet Take 0.5 mg by mouth every 8 (eight)  hours as needed for anxiety.    Taking  . Coenzyme Q10 100 MG capsule Take 100 mg by mouth daily.    Taking  . fish oil-omega-3 fatty acids 1000 MG capsule Take 1 g by mouth 2 (two) times daily.     Taking  . levothyroxine (SYNTHROID) 75 MCG tablet Take 75 mcg by mouth daily before breakfast.    Taking  . Multiple Vitamins-Minerals (PRESERVISION AREDS 2) CAPS Take 1 capsule by mouth 2 (two) times a day.     . Turmeric 500 MG TABS Take 750 mg by mouth daily.     . vitamin C (ASCORBIC ACID) 500 MG tablet Take 500 mg by mouth daily.     Taking   No current facility-administered medications for this visit.    No Known Allergies  Social History   Tobacco Use  . Smoking status: Current Every Day Smoker    Packs/day: 0.50    Years: 40.00    Pack years: 20.00    Types: Cigarettes  . Smokeless tobacco: Never Used  . Tobacco comment: 3 ciggs per day, trying to cut back   Substance Use Topics  . Alcohol use: Yes    Comment: 1-2 glasses of red wine in the evening    Family History  Problem Relation Age of Onset  . Stroke Father        Age 40  . Hypertension Mother   . Diabetes type II Mother      Review of Systems  Constitutional: Positive  for weight loss.  Gastrointestinal: Positive for constipation.  Genitourinary: Positive for frequency and urgency.  Musculoskeletal: Positive for joint pain.  Psychiatric/Behavioral: The patient is nervous/anxious.   All other systems reviewed and are negative.   Objective:  Physical Exam  Constitutional: She is oriented to person, place, and time. She appears well-developed and well-nourished. No distress.  HENT:  Head: Normocephalic and atraumatic.  Nose: Nose normal.  Eyes: Pupils are equal, round, and reactive to light. Conjunctivae and EOM are normal.  Neck: Normal range of motion. Neck supple.  Cardiovascular: Normal rate, regular rhythm, normal heart sounds and intact distal pulses.  Respiratory: Effort normal and breath sounds  normal. No respiratory distress. She has no wheezes.  GI: Soft. Bowel sounds are normal. She exhibits no distension. There is no abdominal tenderness.  Musculoskeletal:     Left hip: She exhibits decreased range of motion, tenderness and bony tenderness.  Lymphadenopathy:    She has no cervical adenopathy.  Neurological: She is alert and oriented to person, place, and time.  Skin: Skin is warm and dry. No rash noted. No erythema.  Psychiatric: She has a normal mood and affect. Her behavior is normal.    Vital signs in last 24 hours: @VSRANGES @  Labs:   Estimated body mass index is 25.89 kg/m as calculated from the following:   Height as of 03/08/19: 4\' 11"  (1.499 m).   Weight as of 03/08/19: 58.2 kg.   Imaging Review Plain radiographs demonstrate severe degenerative joint disease of the left hip(s). The bone quality appears to be good for age and reported activity level.      Assessment/Plan:  End stage arthritis, left hip(s)  The patient history, physical examination, clinical judgement of the provider and imaging studies are consistent with end stage degenerative joint disease of the left hip(s) and total hip arthroplasty is deemed medically necessary. The treatment options including medical management, injection therapy, arthroscopy and arthroplasty were discussed at length. The risks and benefits of total hip arthroplasty were presented and reviewed. The risks due to aseptic loosening, infection, stiffness, dislocation/subluxation,  thromboembolic complications and other imponderables were discussed.  The patient acknowledged the explanation, agreed to proceed with the plan and consent was signed. Patient is being admitted for inpatient treatment for surgery, pain control, PT, OT, prophylactic antibiotics, VTE prophylaxis, progressive ambulation and ADL's and discharge planning.The patient is planning to be discharged home with outpatient PT referral   Anticipated LOS equal to  or greater than 2 midnights due to - Age 68 and older with one or more of the following:  - Obesity  - Expected need for hospital services (PT, OT, Nursing) required for safe  discharge  - Anticipated need for postoperative skilled nursing care or inpatient rehab  - Active co-morbidities: None OR   - Unanticipated findings during/Post Surgery: None  - Patient is a high risk of re-admission due to: None

## 2019-03-23 NOTE — H&P (Signed)
TOTAL HIP ADMISSION H&P  Patient is admitted for left total hip arthroplasty.  Subjective:  Chief Complaint: left hip pain  HPI: Krista Brown, 68 y.o. female, has a history of pain and functional disability in the left hip(s) due to arthritis and patient has failed non-surgical conservative treatments for greater than 12 weeks to include NSAID's and/or analgesics, corticosteriod injections, use of assistive devices and activity modification.  Onset of symptoms was gradual starting 8 years ago with gradually worsening course since that time.The patient noted no past surgery on the left hip(s).  Patient currently rates pain in the left hip at 8 out of 10 with activity. Patient has night pain, worsening of pain with activity and weight bearing, trendelenberg gait, pain that interfers with activities of daily living and pain with passive range of motion. Patient has evidence of subchondral cysts, periarticular osteophytes and joint space narrowing by imaging studies. This condition presents safety issues increasing the risk of falls.  There is no current active infection.  Patient Active Problem List   Diagnosis Date Noted  . Chest pain 01/08/2011  . Unspecified essential hypertension 01/08/2011  . Hepatitis 01/08/2011   Past Medical History:  Diagnosis Date  . Abnormal liver function tests    Hepatic cyst  . Chest pain    Normal coronary arteries 4/12  . Essential hypertension, benign   . Hypothyroidism   . Macular degeneration   . Osteoarthritis       Current Outpatient Medications  Medication Sig Dispense Refill Last Dose  . acetaminophen (TYLENOL) 500 MG tablet Take 1,500 mg by mouth 2 (two) times daily as needed for moderate pain or headache.     Marland Kitchen amLODipine (NORVASC) 10 MG tablet Take 10 mg by mouth daily.   Taking  . Cholecalciferol (VITAMIN D3) 25 MCG (1000 UT) CAPS Take 1,000 Units by mouth daily.     . clonazePAM (KLONOPIN) 0.5 MG tablet Take 0.5 mg by mouth every 8 (eight)  hours as needed for anxiety.    Taking  . Coenzyme Q10 100 MG capsule Take 100 mg by mouth daily.    Taking  . fish oil-omega-3 fatty acids 1000 MG capsule Take 1 g by mouth 2 (two) times daily.     Taking  . levothyroxine (SYNTHROID) 75 MCG tablet Take 75 mcg by mouth daily before breakfast.    Taking  . Multiple Vitamins-Minerals (PRESERVISION AREDS 2) CAPS Take 1 capsule by mouth 2 (two) times a day.     . Turmeric 500 MG TABS Take 750 mg by mouth daily.     . vitamin C (ASCORBIC ACID) 500 MG tablet Take 500 mg by mouth daily.     Taking   No current facility-administered medications for this visit.    No Known Allergies  Social History   Tobacco Use  . Smoking status: Current Every Day Smoker    Packs/day: 0.50    Years: 40.00    Pack years: 20.00    Types: Cigarettes  . Smokeless tobacco: Never Used  . Tobacco comment: 3 ciggs per day, trying to cut back   Substance Use Topics  . Alcohol use: Yes    Comment: 1-2 glasses of red wine in the evening    Family History  Problem Relation Age of Onset  . Stroke Father        Age 40  . Hypertension Mother   . Diabetes type II Mother      Review of Systems  Constitutional: Positive  for weight loss.  Gastrointestinal: Positive for constipation.  Genitourinary: Positive for frequency and urgency.  Musculoskeletal: Positive for joint pain.  Psychiatric/Behavioral: The patient is nervous/anxious.   All other systems reviewed and are negative.   Objective:  Physical Exam  Constitutional: She is oriented to person, place, and time. She appears well-developed and well-nourished. No distress.  HENT:  Head: Normocephalic and atraumatic.  Nose: Nose normal.  Eyes: Pupils are equal, round, and reactive to light. Conjunctivae and EOM are normal.  Neck: Normal range of motion. Neck supple.  Cardiovascular: Normal rate, regular rhythm, normal heart sounds and intact distal pulses.  Respiratory: Effort normal and breath sounds  normal. No respiratory distress. She has no wheezes.  GI: Soft. Bowel sounds are normal. She exhibits no distension. There is no abdominal tenderness.  Musculoskeletal:     Left hip: She exhibits decreased range of motion, tenderness and bony tenderness.  Lymphadenopathy:    She has no cervical adenopathy.  Neurological: She is alert and oriented to person, place, and time.  Skin: Skin is warm and dry. No rash noted. No erythema.  Psychiatric: She has a normal mood and affect. Her behavior is normal.    Vital signs in last 24 hours: @VSRANGES@  Labs:   Estimated body mass index is 25.89 kg/m as calculated from the following:   Height as of 03/08/19: 4' 11" (1.499 m).   Weight as of 03/08/19: 58.2 kg.   Imaging Review Plain radiographs demonstrate severe degenerative joint disease of the left hip(s). The bone quality appears to be good for age and reported activity level.      Assessment/Plan:  End stage arthritis, left hip(s)  The patient history, physical examination, clinical judgement of the provider and imaging studies are consistent with end stage degenerative joint disease of the left hip(s) and total hip arthroplasty is deemed medically necessary. The treatment options including medical management, injection therapy, arthroscopy and arthroplasty were discussed at length. The risks and benefits of total hip arthroplasty were presented and reviewed. The risks due to aseptic loosening, infection, stiffness, dislocation/subluxation,  thromboembolic complications and other imponderables were discussed.  The patient acknowledged the explanation, agreed to proceed with the plan and consent was signed. Patient is being admitted for inpatient treatment for surgery, pain control, PT, OT, prophylactic antibiotics, VTE prophylaxis, progressive ambulation and ADL's and discharge planning.The patient is planning to be discharged home with outpatient PT referral   Anticipated LOS equal to  or greater than 2 midnights due to - Age 65 and older with one or more of the following:  - Obesity  - Expected need for hospital services (PT, OT, Nursing) required for safe  discharge  - Anticipated need for postoperative skilled nursing care or inpatient rehab  - Active co-morbidities: None OR   - Unanticipated findings during/Post Surgery: None  - Patient is a high risk of re-admission due to: None  

## 2019-03-24 ENCOUNTER — Ambulatory Visit: Payer: Self-pay | Admitting: Cardiology

## 2019-03-27 ENCOUNTER — Encounter (HOSPITAL_COMMUNITY)
Admission: RE | Admit: 2019-03-27 | Discharge: 2019-03-27 | Disposition: A | Discharge status: Home / Self Care | Payer: Medicare HMO | Source: Ambulatory Visit | Attending: Orthopedic Surgery | Admitting: Orthopedic Surgery

## 2019-03-27 ENCOUNTER — Other Ambulatory Visit: Payer: Self-pay

## 2019-03-27 ENCOUNTER — Encounter (HOSPITAL_COMMUNITY): Payer: Self-pay

## 2019-03-27 DIAGNOSIS — Z01812 Encounter for preprocedural laboratory examination: Secondary | ICD-10-CM | POA: Insufficient documentation

## 2019-03-27 DIAGNOSIS — M1612 Unilateral primary osteoarthritis, left hip: Secondary | ICD-10-CM | POA: Insufficient documentation

## 2019-03-27 HISTORY — DX: Other specified postprocedural states: R11.2

## 2019-03-27 HISTORY — DX: Other specified postprocedural states: Z98.890

## 2019-03-27 LAB — COMPREHENSIVE METABOLIC PANEL
ALT: 19 U/L (ref 0–44)
AST: 21 U/L (ref 15–41)
Albumin: 3.9 g/dL (ref 3.5–5.0)
Alkaline Phosphatase: 84 U/L (ref 38–126)
Anion gap: 6 (ref 5–15)
BUN: 14 mg/dL (ref 8–23)
CO2: 28 mmol/L (ref 22–32)
Calcium: 10.2 mg/dL (ref 8.9–10.3)
Chloride: 106 mmol/L (ref 98–111)
Creatinine, Ser: 0.59 mg/dL (ref 0.44–1.00)
GFR calc Af Amer: >60 mL/min (ref >60)
GFR calc non Af Amer: >60 mL/min (ref >60)
Glucose, Bld: 91 mg/dL (ref 70–99)
Potassium: 4.2 mmol/L (ref 3.5–5.1)
Sodium: 140 mmol/L (ref 135–145)
Total Bilirubin: 0.5 mg/dL (ref 0.3–1.2)
Total Protein: 6.9 g/dL (ref 6.5–8.1)

## 2019-03-27 LAB — SURGICAL PCR SCREEN
MRSA, PCR: NEGATIVE
Staphylococcus aureus: POSITIVE — AB

## 2019-03-27 LAB — CBC WITH DIFFERENTIAL/PLATELET
Abs Immature Granulocytes: 0.02 10*3/uL (ref 0.00–0.07)
Basophils Absolute: 0 10*3/uL (ref 0.0–0.1)
Basophils Relative: 1 %
Eosinophils Absolute: 0.1 10*3/uL (ref 0.0–0.5)
Eosinophils Relative: 1 %
HCT: 40 % (ref 36.0–46.0)
Hemoglobin: 12.6 g/dL (ref 12.0–15.0)
Immature Granulocytes: 0 %
Lymphocytes Relative: 29 %
Lymphs Abs: 2.5 10*3/uL (ref 0.7–4.0)
MCH: 30.4 pg (ref 26.0–34.0)
MCHC: 31.5 g/dL (ref 30.0–36.0)
MCV: 96.4 fL (ref 80.0–100.0)
Monocytes Absolute: 0.7 10*3/uL (ref 0.1–1.0)
Monocytes Relative: 8 %
Neutro Abs: 5.1 10*3/uL (ref 1.7–7.7)
Neutrophils Relative %: 61 %
Platelets: 286 10*3/uL (ref 150–400)
RBC: 4.15 MIL/uL (ref 3.87–5.11)
RDW: 15.6 % — ABNORMAL HIGH (ref 11.5–15.5)
WBC: 8.4 10*3/uL (ref 4.0–10.5)
nRBC: 0 % (ref 0.0–0.2)

## 2019-03-27 LAB — URINALYSIS, ROUTINE W REFLEX MICROSCOPIC
Bilirubin Urine: NEGATIVE
Glucose, UA: NEGATIVE mg/dL
Hgb urine dipstick: NEGATIVE
Ketones, ur: NEGATIVE mg/dL
Leukocytes,Ua: NEGATIVE
Nitrite: NEGATIVE
Protein, ur: NEGATIVE mg/dL
Specific Gravity, Urine: 1.006 (ref 1.005–1.030)
pH: 6 (ref 5.0–8.0)

## 2019-03-27 LAB — TYPE AND SCREEN
ABO/RH(D): A POS
Antibody Screen: NEGATIVE

## 2019-03-27 LAB — PROTIME-INR
INR: 0.9 (ref 0.8–1.2)
Prothrombin Time: 12.3 seconds (ref 11.4–15.2)

## 2019-03-27 LAB — ABO/RH: ABO/RH(D): A POS

## 2019-03-27 LAB — APTT: aPTT: 32 seconds (ref 24–36)

## 2019-03-27 NOTE — Patient Instructions (Addendum)
YOU NEED TO HAVE A COVID 19 TEST ON__Tuesday 8/11_____ @_2 :00 PM______, THIS TEST MUST BE DONE BEFORE SURGERY, COME  [address], [place] , [phone]. ONCE YOUR COVID TEST IS COMPLETED, PLEASE BEGIN THE QUARANTINE INSTRUCTIONS AS OUTLINED IN YOUR HANDOUT.                Lauraine Rinneoni C Kagan   Your procedure is scheduled on: Friday 04/07/19    [facility] Main  Entr[facility]  Report to admitting at 7:45 AM   1 VISITOR IS ALLOWED TO WAIT IN WAITING ROOM  ONLY DAY OF YOUR SURGERY.    Call this number if you have problems the morning of surgery [phone]    Remember: Do not eat food or drink liquids :After Midnight             BRUSH YOUR TEETH MORNING OF SURGERY AND RINSE YOUR MOUTH OUT, NO CHEWING GUM CANDY OR MINTS.    Do not eat food After Midnight.  YOU MAY HAVE CLEAR LIQUIDS FROM MIDNIGHT UNTIL 4:30AM. At 7:00 AM  Please finish the prescribed Pre-Surgery  Drink.  Nothing by mouth after you finish the  drink !   [place] - Preparing for Surgery Before surgery, you can play an important role.   Because skin is not sterile, your skin needs to be as free of germs as possible .  You can reduce the number of germs on your skin by washing with CHG (chlorahexidine gluconate) soap before surgery.   CHG is an antiseptic cleaner which kills germs and bonds with the skin to continue killing germs even after washing. Please DO NOT use if you have an allergy to CHG or antibacterial soaps .  If your skin becomes reddened/irritated stop using the CHG and inform your nurse when you arrive at Short Stay. Do not shave (including legs and underarms) for at least 48 hours prior to the first CHG shower Please follow these instructions carefully:   1.  Shower with CHG Soap the night before surgery and the  morning of Surgery.  2.  If you choose to wash your hair, wash your hair first as usual with your  normal  shampoo.  3.  After you shampoo, rinse your hair and body thoroughly  to remove the  shampoo.                                        4.  Use CHG as you would any other liquid soap.  You can apply chg directly  to the skin and wash                       Gently with a scrungie or clean washcloth.  5.  Apply the CHG Soap to your body ONLY FROM THE NECK DOWN.   Do not use on face/ open                           Wound or open sores. Avoid contact with eyes, ears mouth and genitals (private parts).                       Wash face,  Genitals (private parts) with your normal soap.             6.  Wash thoroughly,  paying special attention to the area where your surgery  will be performed.  7.  Thoroughly rinse your body with warm water from the neck down.  8.  DO NOT shower/wash with your normal soap after using and rinsing off  the CHG Soap.                9.  Pat yourself dry with a clean towel.            10.  Wear clean pajamas.            11.  Place clean sheets on your bed the night of your first shower and do not  sleep with pets. Day of Surgery : Do not apply any lotions/deodorants the morning of surgery.  Please wear clean clothes to the hospital/surgery center.  FAILURE TO FOLLOW THESE INSTRUCTIONS MAY RESULT IN THE CANCELLATION OF YOUR SURGERY PATIENT SIGNATURE_________________________________  NURSE SIGNATURE__________________________________  ________________________________________________________________________   Adam Phenix  An incentive spirometer is a tool that can help keep your lungs clear and active. This tool measures how well you are filling your lungs with each breath. Taking long deep breaths may help reverse or decrease the chance of developing breathing (pulmonary) problems (especially infection) following:  A long period of time when you are unable to move or be active. BEFORE THE PROCEDURE   If the spirometer includes an indicator to show your best effort, your nurse or respiratory therapist will set it to a desired  goal.  If possible, sit up straight or lean slightly forward. Try not to slouch.  Hold the incentive spirometer in an upright position. INSTRUCTIONS FOR USE  1. Sit on the edge of your bed if possible, or sit up as far as you can in bed or on a chair. 2. Hold the incentive spirometer in an upright position. 3. Breathe out normally. 4. Place the mouthpiece in your mouth and seal your lips tightly around it. 5. Breathe in slowly and as deeply as possible, raising the piston or the ball toward the top of the column. 6. Hold your breath for 3-5 seconds or for as long as possible. Allow the piston or ball to fall to the bottom of the column. 7. Remove the mouthpiece from your mouth and breathe out normally. 8. Rest for a few seconds and repeat Steps 1 through 7 at least 10 times every 1-2 hours when you are awake. Take your time and take a few normal breaths between deep breaths. 9. The spirometer may include an indicator to show your best effort. Use the indicator as a goal to work toward during each repetition. 10. After each set of 10 deep breaths, practice coughing to be sure your lungs are clear. If you have an incision (the cut made at the time of surgery), support your incision when coughing by placing a pillow or rolled up towels firmly against it. Once you are able to get out of bed, walk around indoors and cough well. You may stop using the incentive spirometer when instructed by your caregiver.  RISKS AND COMPLICATIONS  Take your time so you do not get dizzy or light-headed.  If you are in pain, you may need to take or ask for pain medication before doing incentive spirometry. It is harder to take a deep breath if you are having pain. AFTER USE  Rest and breathe slowly and easily.  It can be helpful to keep track of a log of your progress. Your caregiver can  provide you with a simple table to help with this. If you are using the spirometer at home, follow these instructions: SEEK  MEDICAL CARE IF:   You are having difficultly using the spirometer.  You have trouble using the spirometer as often as instructed.  Your pain medication is not giving enough relief while using the spirometer.  You develop fever of 100.5 F (38.1 C) or higher. SEEK IMMEDIATE MEDICAL CARE IF:   You cough up bloody sputum that had not been present before.  You develop fever of 102 F (38.9 C) or greater.  You develop worsening pain at or near the incision site. MAKE SURE YOU:   Understand these instructions.  Will watch your condition.  Will get help right away if you are not doing well or get worse. Document Released: 12/21/2006 Document Revised: 11/02/2011 Document Reviewed: 02/21/2007 Exit[employer] Patient Information 2014 [employer], [place].   ________________________________________________________________________    Take these medicines the morning of surgery with A SIP OF WATER:  DO NOT TAKE ANY DIABETIC MEDICATIONS DAY OF YOUR SURGERY                               You may not have any metal on your body including hair pins and              piercings  Do not wear jewelry, make-up, lotions, powders or perfumes, deodorant             Do not wear nail polish.  Do not shave  48 hours prior to surgery.              Men may shave face and neck.   Do not bring valuables to the hospital. [facility] IS NOT             RESPONSIBLE   FOR VALUABLES.  Contacts, dentures or bridgework may not be worn into surgery.  Leave suitcase in the car. After surgery it may be brought to your room.     Patients discharged the day of surgery will not be allowed to drive home. IF YOU ARE HAVING SURGERY AND GOING HOME THE SAME DAY, YOU MUST HAVE AN ADULT TO DRIVE YOU HOME AND BE WITH YOU FOR 24 HOURS. YOU MAY GO HOME BY TAXI OR UBER OR ORTHERWISE, BUT AN ADULT MUST ACCOMPANY YOU HOME AND STAY WITH YOU FOR 24 HOURS.  Name and phone number of your driver:  Special Instructions: N/A               Please read over the following fact sheets you were given: _____________________________________________________________________

## 2019-03-28 LAB — URINE CULTURE: Culture: NO GROWTH

## 2019-03-28 NOTE — Anesthesia Preprocedure Evaluation (Addendum)
Anesthesia Evaluation  Patient identified by MRN, date of birth, ID band Patient awake    Reviewed: Allergy & Precautions, NPO status , Patient's Chart, lab work & pertinent test results  History of Anesthesia Complications (+) PONV  Airway Mallampati: II  TM Distance: >3 FB Neck ROM: Full    Dental  (+) Teeth Intact, Dental Advisory Given, Caps,    Pulmonary Current Smoker,    breath sounds clear to auscultation       Cardiovascular hypertension, Pt. on medications  Rhythm:Regular Rate:Normal     Neuro/Psych    GI/Hepatic negative GI ROS, (+) Hepatitis -  Endo/Other  Hypothyroidism   Renal/GU      Musculoskeletal   Abdominal   Peds  Hematology   Anesthesia Other Findings   Reproductive/Obstetrics                          Anesthesia Physical Anesthesia Plan  ASA: III  Anesthesia Plan: General   Post-op Pain Management:    Induction:   PONV Risk Score and Plan: 3 and Ondansetron, Dexamethasone and Midazolam  Airway Management Planned: Oral ETT  Additional Equipment:   Intra-op Plan:   Post-operative Plan: Extubation in OR  Informed Consent: I have reviewed the patients History and Physical, chart, labs and discussed the procedure including the risks, benefits and alternatives for the proposed anesthesia with the patient or authorized representative who has indicated his/her understanding and acceptance.       Plan Discussed with: CRNA and Anesthesiologist  Anesthesia Plan Comments: (Per cardiologist, Dr. Harl Bowie, "Preoperative evaluation - limited by hip but essentially tolerates greater than 4METs without significant cardiopulmonary symptoms - recommend proceeding with surgery as planned.")       Anesthesia Quick Evaluation

## 2019-04-04 ENCOUNTER — Other Ambulatory Visit (HOSPITAL_COMMUNITY)
Admission: RE | Admit: 2019-04-04 | Discharge: 2019-04-04 | Disposition: A | Discharge status: Home / Self Care | Payer: Medicare HMO | Source: Ambulatory Visit | Attending: Orthopedic Surgery | Admitting: Orthopedic Surgery

## 2019-04-04 DIAGNOSIS — Z01812 Encounter for preprocedural laboratory examination: Secondary | ICD-10-CM | POA: Insufficient documentation

## 2019-04-04 DIAGNOSIS — Z20828 Contact with and (suspected) exposure to other viral communicable diseases: Secondary | ICD-10-CM | POA: Insufficient documentation

## 2019-04-04 LAB — SARS CORONAVIRUS 2 (TAT 6-24 HRS): SARS Coronavirus 2: NEGATIVE

## 2019-04-06 MED ORDER — TRANEXAMIC ACID 1000 MG/10ML IV SOLN
2000.0000 mg | INTRAVENOUS | Status: DC
  Filled 2019-04-06: qty 20

## 2019-04-06 MED ORDER — BUPIVACAINE LIPOSOME 1.3 % IJ SUSP
10.0000 mL | Freq: Once | INTRAMUSCULAR | Status: DC
  Filled 2019-04-06: qty 10

## 2019-04-07 ENCOUNTER — Encounter (HOSPITAL_COMMUNITY)
Admission: RE | Disposition: A | Discharge status: Home / Self Care | Payer: Self-pay | Source: Home / Self Care | Attending: Orthopedic Surgery

## 2019-04-07 ENCOUNTER — Ambulatory Visit (HOSPITAL_COMMUNITY): Payer: Medicare HMO | Admitting: Physician Assistant

## 2019-04-07 ENCOUNTER — Ambulatory Visit (HOSPITAL_COMMUNITY): Payer: Medicare HMO | Admitting: Anesthesiology

## 2019-04-07 ENCOUNTER — Observation Stay (HOSPITAL_COMMUNITY): Payer: Medicare HMO

## 2019-04-07 ENCOUNTER — Other Ambulatory Visit: Payer: Self-pay

## 2019-04-07 ENCOUNTER — Encounter (HOSPITAL_COMMUNITY): Payer: Self-pay | Admitting: *Deleted

## 2019-04-07 ENCOUNTER — Observation Stay (HOSPITAL_COMMUNITY)
Admission: RE | Admit: 2019-04-07 | Discharge: 2019-04-08 | Disposition: A | Discharge status: Home / Self Care | Payer: Medicare HMO | Attending: Orthopedic Surgery | Admitting: Orthopedic Surgery

## 2019-04-07 DIAGNOSIS — E039 Hypothyroidism, unspecified: Secondary | ICD-10-CM | POA: Diagnosis not present

## 2019-04-07 DIAGNOSIS — I10 Essential (primary) hypertension: Secondary | ICD-10-CM | POA: Diagnosis present

## 2019-04-07 DIAGNOSIS — Z96642 Presence of left artificial hip joint: Secondary | ICD-10-CM | POA: Diagnosis not present

## 2019-04-07 DIAGNOSIS — E669 Obesity, unspecified: Secondary | ICD-10-CM | POA: Diagnosis not present

## 2019-04-07 DIAGNOSIS — M1612 Unilateral primary osteoarthritis, left hip: Secondary | ICD-10-CM | POA: Diagnosis not present

## 2019-04-07 DIAGNOSIS — F17210 Nicotine dependence, cigarettes, uncomplicated: Secondary | ICD-10-CM | POA: Insufficient documentation

## 2019-04-07 DIAGNOSIS — Z79899 Other long term (current) drug therapy: Secondary | ICD-10-CM | POA: Diagnosis not present

## 2019-04-07 DIAGNOSIS — F419 Anxiety disorder, unspecified: Secondary | ICD-10-CM | POA: Diagnosis not present

## 2019-04-07 DIAGNOSIS — K759 Inflammatory liver disease, unspecified: Secondary | ICD-10-CM | POA: Diagnosis not present

## 2019-04-07 DIAGNOSIS — Z471 Aftercare following joint replacement surgery: Secondary | ICD-10-CM | POA: Diagnosis not present

## 2019-04-07 DIAGNOSIS — Z793 Long term (current) use of hormonal contraceptives: Secondary | ICD-10-CM | POA: Insufficient documentation

## 2019-04-07 DIAGNOSIS — M25752 Osteophyte, left hip: Secondary | ICD-10-CM | POA: Diagnosis not present

## 2019-04-07 DIAGNOSIS — R69 Illness, unspecified: Secondary | ICD-10-CM | POA: Diagnosis not present

## 2019-04-07 DIAGNOSIS — Z6825 Body mass index (BMI) 25.0-25.9, adult: Secondary | ICD-10-CM | POA: Insufficient documentation

## 2019-04-07 HISTORY — PX: TOTAL HIP ARTHROPLASTY: SHX124

## 2019-04-07 SURGERY — ARTHROPLASTY, HIP, TOTAL,POSTERIOR APPROACH
Anesthesia: General | Site: Hip | Laterality: Left

## 2019-04-07 MED ORDER — FENTANYL CITRATE (PF) 100 MCG/2ML IJ SOLN
INTRAMUSCULAR | Status: DC | PRN
  Administered 2019-04-07 (×4): 50 ug via INTRAVENOUS

## 2019-04-07 MED ORDER — HYDROMORPHONE HCL 1 MG/ML IJ SOLN: 0.5000 mg | INTRAMUSCULAR | Status: DC | PRN

## 2019-04-07 MED ORDER — SUGAMMADEX SODIUM 200 MG/2ML IV SOLN
INTRAVENOUS | Status: DC | PRN
  Administered 2019-04-07: 200 mg via INTRAVENOUS

## 2019-04-07 MED ORDER — FENTANYL CITRATE (PF) 100 MCG/2ML IJ SOLN
INTRAMUSCULAR | Status: AC
  Filled 2019-04-07: qty 2

## 2019-04-07 MED ORDER — BUPIVACAINE LIPOSOME 1.3 % IJ SUSP
INTRAMUSCULAR | Status: DC | PRN
  Administered 2019-04-07: 10 mL

## 2019-04-07 MED ORDER — ASPIRIN EC 81 MG PO TBEC: 81.0000 mg | DELAYED_RELEASE_TABLET | Freq: Two times a day (BID) | ORAL | 0 refills | Status: AC

## 2019-04-07 MED ORDER — LEVOTHYROXINE SODIUM 75 MCG PO TABS
75.0000 ug | ORAL_TABLET | Freq: Every day | ORAL | Status: DC
  Administered 2019-04-08: 75 ug via ORAL
  Filled 2019-04-07: qty 1

## 2019-04-07 MED ORDER — DOCUSATE SODIUM 100 MG PO CAPS
100.0000 mg | ORAL_CAPSULE | Freq: Two times a day (BID) | ORAL | Status: DC
  Administered 2019-04-07 – 2019-04-08 (×2): 100 mg via ORAL
  Filled 2019-04-07 (×2): qty 1

## 2019-04-07 MED ORDER — SODIUM CHLORIDE 0.9 % IR SOLN
Status: DC | PRN
  Administered 2019-04-07: 1000 mL

## 2019-04-07 MED ORDER — ACETAMINOPHEN 500 MG PO TABS
1000.0000 mg | ORAL_TABLET | Freq: Four times a day (QID) | ORAL | Status: AC
  Administered 2019-04-07 – 2019-04-08 (×4): 1000 mg via ORAL
  Filled 2019-04-07 (×4): qty 2

## 2019-04-07 MED ORDER — TRANEXAMIC ACID-NACL 1000-0.7 MG/100ML-% IV SOLN
1000.0000 mg | Freq: Once | INTRAVENOUS | Status: AC
  Administered 2019-04-07: 1000 mg via INTRAVENOUS
  Filled 2019-04-07: qty 100

## 2019-04-07 MED ORDER — ROCURONIUM BROMIDE 10 MG/ML (PF) SYRINGE
PREFILLED_SYRINGE | INTRAVENOUS | Status: DC | PRN
  Administered 2019-04-07: 10 mg via INTRAVENOUS
  Administered 2019-04-07: 40 mg via INTRAVENOUS

## 2019-04-07 MED ORDER — LIDOCAINE 2% (20 MG/ML) 5 ML SYRINGE
INTRAMUSCULAR | Status: DC | PRN
  Administered 2019-04-07: 60 mg via INTRAVENOUS

## 2019-04-07 MED ORDER — MENTHOL 3 MG MT LOZG: 1.0000 | LOZENGE | OROMUCOSAL | Status: DC | PRN

## 2019-04-07 MED ORDER — KETOROLAC TROMETHAMINE 30 MG/ML IJ SOLN
INTRAMUSCULAR | Status: AC
  Filled 2019-04-07: qty 1

## 2019-04-07 MED ORDER — FLEET ENEMA 7-19 GM/118ML RE ENEM: 1.0000 | ENEMA | Freq: Once | RECTAL | Status: DC | PRN

## 2019-04-07 MED ORDER — MIDAZOLAM HCL 2 MG/2ML IJ SOLN
INTRAMUSCULAR | Status: DC | PRN
  Administered 2019-04-07: 2 mg via INTRAVENOUS

## 2019-04-07 MED ORDER — PHENOL 1.4 % MT LIQD
1.0000 | OROMUCOSAL | Status: DC | PRN
  Filled 2019-04-07: qty 177

## 2019-04-07 MED ORDER — ONDANSETRON HCL 4 MG/2ML IJ SOLN
INTRAMUSCULAR | Status: DC | PRN
  Administered 2019-04-07: 4 mg via INTRAVENOUS

## 2019-04-07 MED ORDER — PROPOFOL 10 MG/ML IV BOLUS
INTRAVENOUS | Status: AC
  Filled 2019-04-07: qty 20

## 2019-04-07 MED ORDER — DEXAMETHASONE SODIUM PHOSPHATE 10 MG/ML IJ SOLN
INTRAMUSCULAR | Status: DC | PRN
  Administered 2019-04-07: 5 mg via INTRAVENOUS

## 2019-04-07 MED ORDER — TRANEXAMIC ACID 1000 MG/10ML IV SOLN
INTRAVENOUS | Status: DC | PRN
  Administered 2019-04-07: 2000 mg via TOPICAL

## 2019-04-07 MED ORDER — SODIUM CHLORIDE (PF) 0.9 % IJ SOLN
INTRAMUSCULAR | Status: AC
  Filled 2019-04-07: qty 20

## 2019-04-07 MED ORDER — METOCLOPRAMIDE HCL 5 MG/ML IJ SOLN: 5.0000 mg | Freq: Three times a day (TID) | INTRAMUSCULAR | Status: DC | PRN

## 2019-04-07 MED ORDER — OXYCODONE HCL 5 MG PO TABS
5.0000 mg | ORAL_TABLET | ORAL | Status: DC | PRN
  Administered 2019-04-07 – 2019-04-08 (×2): 5 mg via ORAL
  Administered 2019-04-08 (×2): 10 mg via ORAL
  Filled 2019-04-07: qty 1
  Filled 2019-04-07 (×2): qty 2
  Filled 2019-04-07: qty 1

## 2019-04-07 MED ORDER — KETOROLAC TROMETHAMINE 30 MG/ML IJ SOLN
INTRAMUSCULAR | Status: DC | PRN
  Administered 2019-04-07: 30 mg via INTRAVENOUS

## 2019-04-07 MED ORDER — MIDAZOLAM HCL 2 MG/2ML IJ SOLN
INTRAMUSCULAR | Status: AC
  Filled 2019-04-07: qty 2

## 2019-04-07 MED ORDER — CHLORHEXIDINE GLUCONATE 4 % EX LIQD: 60.0000 mL | Freq: Once | CUTANEOUS | Status: DC

## 2019-04-07 MED ORDER — LIDOCAINE 2% (20 MG/ML) 5 ML SYRINGE
INTRAMUSCULAR | Status: AC
  Filled 2019-04-07: qty 5

## 2019-04-07 MED ORDER — DIPHENHYDRAMINE HCL 12.5 MG/5ML PO ELIX: 12.5000 mg | ORAL_SOLUTION | ORAL | Status: DC | PRN

## 2019-04-07 MED ORDER — SENNOSIDES-DOCUSATE SODIUM 8.6-50 MG PO TABS
1.0000 | ORAL_TABLET | Freq: Every evening | ORAL | Status: DC | PRN
  Administered 2019-04-07: 1 via ORAL
  Filled 2019-04-07: qty 1

## 2019-04-07 MED ORDER — CEFAZOLIN SODIUM-DEXTROSE 2-4 GM/100ML-% IV SOLN
2.0000 g | INTRAVENOUS | Status: AC
  Administered 2019-04-07: 2 g via INTRAVENOUS
  Filled 2019-04-07: qty 100

## 2019-04-07 MED ORDER — TRANEXAMIC ACID-NACL 1000-0.7 MG/100ML-% IV SOLN
1000.0000 mg | INTRAVENOUS | Status: AC
  Administered 2019-04-07: 11:00:00 1000 mg via INTRAVENOUS
  Filled 2019-04-07: qty 100

## 2019-04-07 MED ORDER — METOCLOPRAMIDE HCL 5 MG PO TABS: 5.0000 mg | ORAL_TABLET | Freq: Three times a day (TID) | ORAL | Status: DC | PRN

## 2019-04-07 MED ORDER — PROPOFOL 10 MG/ML IV BOLUS
INTRAVENOUS | Status: DC | PRN
  Administered 2019-04-07: 150 mg via INTRAVENOUS
  Administered 2019-04-07: 50 mg via INTRAVENOUS

## 2019-04-07 MED ORDER — AMLODIPINE BESYLATE 10 MG PO TABS
10.0000 mg | ORAL_TABLET | Freq: Every day | ORAL | Status: DC
  Administered 2019-04-08: 10 mg via ORAL
  Filled 2019-04-07: qty 1

## 2019-04-07 MED ORDER — BUPIVACAINE-EPINEPHRINE 0.25% -1:200000 IJ SOLN
INTRAMUSCULAR | Status: DC | PRN
  Administered 2019-04-07: 20 mL

## 2019-04-07 MED ORDER — ONDANSETRON HCL 4 MG/2ML IJ SOLN
INTRAMUSCULAR | Status: AC
  Filled 2019-04-07: qty 2

## 2019-04-07 MED ORDER — VANCOMYCIN HCL IN DEXTROSE 1-5 GM/200ML-% IV SOLN
1000.0000 mg | Freq: Once | INTRAVENOUS | Status: AC
  Administered 2019-04-07: 1000 mg via INTRAVENOUS
  Filled 2019-04-07: qty 200

## 2019-04-07 MED ORDER — VANCOMYCIN HCL IN DEXTROSE 1-5 GM/200ML-% IV SOLN
1000.0000 mg | Freq: Two times a day (BID) | INTRAVENOUS | Status: AC
  Administered 2019-04-07: 1000 mg via INTRAVENOUS
  Filled 2019-04-07: qty 200

## 2019-04-07 MED ORDER — OXYCODONE HCL 5 MG PO TABS: ORAL_TABLET | ORAL | 0 refills | Status: AC

## 2019-04-07 MED ORDER — ASPIRIN 81 MG PO CHEW
81.0000 mg | CHEWABLE_TABLET | Freq: Two times a day (BID) | ORAL | Status: DC
  Administered 2019-04-08: 81 mg via ORAL
  Filled 2019-04-07: qty 1

## 2019-04-07 MED ORDER — POVIDONE-IODINE 10 % EX SWAB
2.0000 "application " | Freq: Once | CUTANEOUS | Status: AC
  Administered 2019-04-07: 2 via TOPICAL

## 2019-04-07 MED ORDER — BUPIVACAINE-EPINEPHRINE (PF) 0.25% -1:200000 IJ SOLN
INTRAMUSCULAR | Status: AC
  Filled 2019-04-07: qty 30

## 2019-04-07 MED ORDER — SUCCINYLCHOLINE CHLORIDE 200 MG/10ML IV SOSY
PREFILLED_SYRINGE | INTRAVENOUS | Status: DC | PRN
  Administered 2019-04-07: 110 mg via INTRAVENOUS

## 2019-04-07 MED ORDER — DEXAMETHASONE SODIUM PHOSPHATE 10 MG/ML IJ SOLN
INTRAMUSCULAR | Status: AC
  Filled 2019-04-07: qty 1

## 2019-04-07 MED ORDER — LACTATED RINGERS IV SOLN
INTRAVENOUS | Status: DC
  Administered 2019-04-07 (×2): via INTRAVENOUS

## 2019-04-07 MED ORDER — ONDANSETRON HCL 4 MG PO TABS: 4.0000 mg | ORAL_TABLET | Freq: Four times a day (QID) | ORAL | Status: DC | PRN

## 2019-04-07 MED ORDER — SORBITOL 70 % SOLN
30.0000 mL | Freq: Every day | Status: DC | PRN
  Filled 2019-04-07: qty 30

## 2019-04-07 MED ORDER — SODIUM CHLORIDE 0.9 % IV SOLN
INTRAVENOUS | Status: DC
  Administered 2019-04-07: 17:00:00 via INTRAVENOUS

## 2019-04-07 MED ORDER — ONDANSETRON HCL 4 MG/2ML IJ SOLN: 4.0000 mg | Freq: Four times a day (QID) | INTRAMUSCULAR | Status: DC | PRN

## 2019-04-07 MED ORDER — SODIUM CHLORIDE (PF) 0.9 % IJ SOLN
INTRAMUSCULAR | Status: DC | PRN
  Administered 2019-04-07: 20 mL

## 2019-04-07 MED ORDER — FENTANYL CITRATE (PF) 100 MCG/2ML IJ SOLN
25.0000 ug | INTRAMUSCULAR | Status: DC | PRN
  Administered 2019-04-07 (×2): 50 ug via INTRAVENOUS

## 2019-04-07 MED ORDER — ROCURONIUM BROMIDE 10 MG/ML (PF) SYRINGE
PREFILLED_SYRINGE | INTRAVENOUS | Status: AC
  Filled 2019-04-07: qty 10

## 2019-04-07 MED ORDER — EPHEDRINE 5 MG/ML INJ
INTRAVENOUS | Status: AC
  Filled 2019-04-07: qty 10

## 2019-04-07 MED ORDER — CLONAZEPAM 0.5 MG PO TABS: 0.5000 mg | ORAL_TABLET | Freq: Three times a day (TID) | ORAL | Status: DC | PRN

## 2019-04-07 MED ORDER — SODIUM CHLORIDE 0.9 % IV SOLN: INTRAVENOUS | Status: DC

## 2019-04-07 MED ORDER — ACETAMINOPHEN 325 MG PO TABS: 325.0000 mg | ORAL_TABLET | Freq: Four times a day (QID) | ORAL | Status: DC | PRN

## 2019-04-07 MED ORDER — EPHEDRINE SULFATE 50 MG/ML IJ SOLN
INTRAMUSCULAR | Status: DC | PRN
  Administered 2019-04-07: 15 mg via INTRAVENOUS

## 2019-04-07 SURGICAL SUPPLY — 57 items
BAG DECANTER FOR FLEXI CONT (MISCELLANEOUS) ×2 IMPLANT
BAG ZIPLOCK 12X15 (MISCELLANEOUS) ×2 IMPLANT
BALL HIP CERAMIC (Hips) IMPLANT
BENZOIN TINCTURE PRP APPL 2/3 (GAUZE/BANDAGES/DRESSINGS) ×1 IMPLANT
BLADE SAW SGTL 73X25 THK (BLADE) ×2 IMPLANT
CLSR STERI-STRIP ANTIMIC 1/2X4 (GAUZE/BANDAGES/DRESSINGS) ×1 IMPLANT
COVER SURGICAL LIGHT HANDLE (MISCELLANEOUS) ×2 IMPLANT
COVER WAND RF STERILE (DRAPES) IMPLANT
CUP ACET PINNACLE SECTR 48MM (Joint) IMPLANT
DECANTER SPIKE VIAL GLASS SM (MISCELLANEOUS) ×6 IMPLANT
DRAPE INCISE IOBAN 66X45 STRL (DRAPES) ×2 IMPLANT
DRAPE ORTHO SPLIT 77X108 STRL (DRAPES) ×2
DRAPE POUCH INSTRU U-SHP 10X18 (DRAPES) ×2 IMPLANT
DRAPE SURG ORHT 6 SPLT 77X108 (DRAPES) ×2 IMPLANT
DRAPE U-SHAPE 47X51 STRL (DRAPES) ×2 IMPLANT
DRESSING AQUACEL AG SP 3.5X10 (GAUZE/BANDAGES/DRESSINGS) ×1 IMPLANT
DRSG AQUACEL AG SP 3.5X10 (GAUZE/BANDAGES/DRESSINGS) ×2
DRSG EMULSION OIL 3X16 NADH (GAUZE/BANDAGES/DRESSINGS) ×1 IMPLANT
ELECT BLADE TIP CTD 4 INCH (ELECTRODE) ×2 IMPLANT
ELECT REM PT RETURN 15FT ADLT (MISCELLANEOUS) ×2 IMPLANT
FACESHIELD WRAPAROUND (MASK) ×6 IMPLANT
FACESHIELD WRAPAROUND OR TEAM (MASK) ×3 IMPLANT
GLOVE BIOGEL PI IND STRL 8 (GLOVE) ×2 IMPLANT
GLOVE BIOGEL PI INDICATOR 8 (GLOVE) ×2
GLOVE SURG ORTHO 8.0 STRL STRW (GLOVE) ×2 IMPLANT
GLOVE SURG SS PI 7.5 STRL IVOR (GLOVE) ×2 IMPLANT
GOWN STRL REUS W/TWL XL LVL3 (GOWN DISPOSABLE) ×4 IMPLANT
HIP BALL CERAMIC (Hips) ×2 IMPLANT
HOLDER FOLEY CATH W/STRAP (MISCELLANEOUS) ×2 IMPLANT
HOOD PEEL AWAY FLYTE STAYCOOL (MISCELLANEOUS) ×2 IMPLANT
IMMOBILIZER KNEE 20 (SOFTGOODS) ×2
IMMOBILIZER KNEE 20 THIGH 36 (SOFTGOODS) ×1 IMPLANT
KIT BASIN OR (CUSTOM PROCEDURE TRAY) ×2 IMPLANT
KIT TURNOVER KIT A (KITS) ×1 IMPLANT
LINER PINN 32X48 HIP (Liner) ×1 IMPLANT
MANIFOLD NEPTUNE II (INSTRUMENTS) ×2 IMPLANT
NEEDLE HYPO 22GX1.5 SAFETY (NEEDLE) ×4 IMPLANT
NS IRRIG 1000ML POUR BTL (IV SOLUTION) ×1 IMPLANT
PACK TOTAL JOINT (CUSTOM PROCEDURE TRAY) ×2 IMPLANT
PINNSECTOR W/GRIP ACE CUP 48MM (Joint) ×2 IMPLANT
PROTECTOR NERVE ULNAR (MISCELLANEOUS) ×2 IMPLANT
STAPLER VISISTAT 35W (STAPLE) IMPLANT
STEM AML 12X155X30 STD SM 6IN (Joint) ×1 IMPLANT
STRIP CLOSURE SKIN 1/2X4 (GAUZE/BANDAGES/DRESSINGS) ×1 IMPLANT
SUCTION FRAZIER HANDLE 12FR (TUBING) ×1
SUCTION TUBE FRAZIER 12FR DISP (TUBING) ×1 IMPLANT
SUT ETHIBOND NAB CT1 #1 30IN (SUTURE) ×8 IMPLANT
SUT MNCRL AB 3-0 PS2 18 (SUTURE) ×2 IMPLANT
SUT VIC AB 0 CT1 36 (SUTURE) ×4 IMPLANT
SUT VIC AB 2-0 CT1 27 (SUTURE) ×2
SUT VIC AB 2-0 CT1 TAPERPNT 27 (SUTURE) ×2 IMPLANT
SYR CONTROL 10ML LL (SYRINGE) ×4 IMPLANT
TOWEL OR 17X26 10 PK STRL BLUE (TOWEL DISPOSABLE) ×2 IMPLANT
TRAY FOLEY MTR SLVR 14FR STAT (SET/KITS/TRAYS/PACK) ×1 IMPLANT
TRAY FOLEY MTR SLVR 16FR STAT (SET/KITS/TRAYS/PACK) ×1 IMPLANT
WATER STERILE IRR 1000ML POUR (IV SOLUTION) ×4 IMPLANT
YANKAUER SUCT BULB TIP 10FT TU (MISCELLANEOUS) ×2 IMPLANT

## 2019-04-07 NOTE — Anesthesia Procedure Notes (Signed)
Procedure Name: Intubation Date/Time: 04/07/2019 11:01 AM Performed by: Wanita Chamberlain, CRNA Pre-anesthesia Checklist: Patient identified, Emergency Drugs available, Suction available, Patient being monitored and Timeout performed Patient Re-evaluated:Patient Re-evaluated prior to induction Oxygen Delivery Method: Circle system utilized Preoxygenation: Pre-oxygenation with 100% oxygen Induction Type: IV induction Ventilation: Mask ventilation without difficulty Laryngoscope Size: Mac and 3 Grade View: Grade I Tube type: Oral Number of attempts: 1 Airway Equipment and Method: Stylet Placement Confirmation: breath sounds checked- equal and bilateral,  CO2 detector,  positive ETCO2 and ETT inserted through vocal cords under direct vision Secured at: 21 cm Tube secured with: Tape Dental Injury: Teeth and Oropharynx as per pre-operative assessment

## 2019-04-07 NOTE — Discharge Instructions (Signed)

## 2019-04-07 NOTE — Interval H&P Note (Signed)
History and Physical Interval Note:  04/07/2019 9:33 AM  Krista Brown  has presented today for surgery, with the diagnosis of OA LEFT HIP.  The various methods of treatment have been discussed with the patient and family. After consideration of risks, benefits and other options for treatment, the patient has consented to  Procedure(s): TOTAL HIP ARTHROPLASTY (Left) as a surgical intervention.  The patient's history has been reviewed, patient examined, no change in status, stable for surgery.  I have reviewed the patient's chart and labs.  Questions were answered to the patient's satisfaction.     Yvette Rack

## 2019-04-07 NOTE — Op Note (Signed)
NAME: Krista Brown, Krista Brown MEDICAL RECORD ZD:[id number] ACCOUNT [id number] DATE OF BIRTH:February 20, 1951 FACILITY: [facility] LOCATION: [facility] PHYSICIAN:W. Latrell Potempa JR., MD  OPERATIVE REPORT  DATE OF PROCEDURE:  04/07/2019  PREOPERATIVE DIAGNOSIS:  Severe osteoarthritis, left hip.  POSTOPERATIVE DIAGNOSIS:  Severe osteoarthritis, left hip.  OPERATION:  Left total hip replacement (DePuy AML 12.0 mm stem with +5 mm neck with 32 mm Biolox ceramic femoral head 32 mm diameter, acetabular cup Pinnacle, acetabular shell sector 48 mm outside diameter, with Pinnacle AltrX polyethylene acetabular  liner +4 mm 10 degree.  SURGEON:  Vangie Bicker, MD  ASSISTANTMarjo Bicker.    ESTIMATED BLOOD LOSS:  250.  DESCRIPTION OF PROCEDURE:  Lateral position with a posterior approach to the hip was made.  We split the iliotibial band and gluteus maximus fascia and did a T capsulotomy in the hip and released a portion of the external rotators.  Hip was severely  deformed with superior migration and posterior wear the acetabulum.  We cut the femoral head about one fingerbreadth above the lesser troch and progressively reamed and broached to accept a 12 mm small statured stem.  Attention was next directed at the acetabulum.  Extensive osteophytes were removed from the superior, anterior and inferior acetabulum.  We placed wing retractors superiorly and posteriorly with an anterior retractor and inferior retractor.  We then  progressively reamed the acetabulum for 1 mm under reaming to accept a 48 mm diameter cup with about 40-45 degrees of abduction 10-15 degrees anteversion.  Final cup was inserted with the trial liner and the broach was used as a trial.  All positions of  the hip were stable with no evidence of any impingement.  She could only be dislocated with extreme flexion past 90 degrees, full abduction and internal rotation past 90 degrees.  Final components were inserted.  Acetabular shell lining with the   posterior buildup at about the 3 o'clock position for the left hip followed by placement of the final prosthesis.  We then trialed off that settled with the +5 mm neck length with 32 mm ceramic hip ball.  Wound had been copiously irrigated throughout the  case.  We also infiltrated the subcutaneous tissues with Exparel and used topical as well as systemic TXA to minimize blood loss.  T capsulotomy was closed with interrupted running Ethibond on the fascia with a 2-0 Vicryl and Monocryl in the skin.  A  lightly compressive sterile dressing applied with a knee immobilizer.  Taken to recovery room in stable condition.  TN/NUANCE  D:04/07/2019 T:04/07/2019 JOB:[id number]

## 2019-04-07 NOTE — Evaluation (Signed)
Physical Therapy Evaluation Patient Details Name: Krista Brown MRN: [id number] DOB: 04/09/1951 Today's Date: 04/07/2019   History of Present Illness  68 yo female s/p L THA posterior approach on 04/07/19. PMH includes HTN, macular degeneration, and OA.  Clinical Impression  Pt presents with L hip pain, decreased L hip strength post-operatively, decreased knowledge of posterior hip precautions, difficulty performing bed mobility, increased time and effort to perform mobility tasks, and decreased activity tolerance due to L hip pain. Pt to benefit from acute PT to address deficits. Pt ambulated short hallway distance with RW with min guard assist, PT reinforcing posterior hip precautions throughout mobility. Pt educated on ankle pumps (20/hour) to perform this afternoon/evening to increase circulation, to pt's tolerance and limited by pain. PT to progress mobility as tolerated, and will continue to follow acutely.        Follow Up Recommendations Follow surgeon's recommendation for DC plan and follow-up therapies;Supervision for mobility/OOB(OPPT on Monday)    Equipment Recommendations  Rolling walker with 5" wheels    Recommendations for Other Services       Precautions / Restrictions Precautions Precautions: Fall;Posterior Hip Precaution Booklet Issued: Yes (comment) Precaution Comments: handout administered - PT reviewed, demonstrated, and practiced no hip flexion >90*, no hip adduction past midline/0*, no crossing ankles, and so hip internal rotation past 0* with pt. Restrictions Weight Bearing Restrictions: No LLE Weight Bearing: Weight bearing as tolerated      Mobility  Bed Mobility Overal bed mobility: Needs Assistance Bed Mobility: Supine to Sit     Supine to sit: Min assist;HOB elevated     General bed mobility comments: Min assist for LLE lifting and translation to EOB, exited bed out of L side. PT reinforcing upright trunk to avoid hip flexion  >90*  Transfers Overall transfer level: Needs assistance Equipment used: Rolling walker (2 wheeled) Transfers: Sit to/from Stand Sit to Stand: Min assist;From elevated surface         General transfer comment: Min assist for initial power up, pt with self-steadying upon standing. Verbal cuing to keep trunk upright with rise to maintain hip precautions, as well as place LLE in front of BoS when moving from stand>sit to avoid hip flexion >90*.  Ambulation/Gait Ambulation/Gait assistance: Min guard Gait Distance (Feet): 30 Feet Assistive device: Rolling walker (2 wheeled) Gait Pattern/deviations: Step-through pattern;Decreased stride length;Step-to pattern;Antalgic;Trunk flexed Gait velocity: decr   General Gait Details: min guard for safety. Verbal cuing for sequencing, maintaining neutral hip to hip ER with turning, upright posture with depressed shoulder position.  Stairs            Wheelchair Mobility    Modified Rankin (Stroke Patients Only)       Balance Overall balance assessment: Mild deficits observed, not formally tested                                           Pertinent Vitals/Pain Pain Assessment: 0-10 Pain Score: 3  Pain Location: L hip Pain Descriptors / Indicators: Sore;Tightness Pain Intervention(s): Limited activity within patient's tolerance;Monitored during session;Premedicated before session;Repositioned    Home Living Family/patient expects to be discharged to:: Private residence Living Arrangements: Children;Parent(will be staying with 990 yo mother due to accessibility of her house) Available Help at Discharge: Family;Available PRN/intermittently Type of Home: House Home Access: Ramped entrance     Home Layout: One level Home Equipment: Bedside commode;Walker -  4 wheels;Cane - single point;Shower seat;Adaptive equipment      Prior Function Level of Independence: Independent with assistive device(s)          Comments: pt reports using cane for ambulation PTA     Hand Dominance   Dominant Hand: Left    Extremity/Trunk Assessment   Upper Extremity Assessment Upper Extremity Assessment: Overall WFL for tasks assessed    Lower Extremity Assessment Lower Extremity Assessment: Overall WFL for tasks assessed;LLE deficits/detail LLE Deficits / Details: suspected post-surgical hip weakness; able to perform ankle pumps, quad set, heel slide, SLR during bed mobility LLE Sensation: WNL    Cervical / Trunk Assessment Cervical / Trunk Assessment: Normal  Communication   Communication: No difficulties  Cognition Arousal/Alertness: Awake/alert Behavior During Therapy: WFL for tasks assessed/performed Overall Cognitive Status: Within Functional Limits for tasks assessed                                        General Comments      Exercises     Assessment/Plan    PT Assessment Patient needs continued PT services  PT Problem List Decreased strength;Decreased mobility;Decreased range of motion;Decreased knowledge of precautions;Decreased activity tolerance;Decreased balance;Decreased knowledge of use of DME;Pain       PT Treatment Interventions DME instruction;Therapeutic activities;Gait training;Therapeutic exercise;Patient/family education;Balance training;Stair training;Functional mobility training    PT Goals (Current goals can be found in the Care Plan section)  Acute Rehab PT Goals Patient Stated Goal: go home tomorrow PT Goal Formulation: With patient Time For Goal Achievement: 04/14/19 Potential to Achieve Goals: Good    Frequency 7X/week   Barriers to discharge        Co-evaluation               AM-PAC PT "6 Clicks" Mobility  Outcome Measure Help needed turning from your back to your side while in a flat bed without using bedrails?: A Little Help needed moving from lying on your back to sitting on the side of a flat bed without using bedrails?:  A Little Help needed moving to and from a bed to a chair (including a wheelchair)?: A Little Help needed standing up from a chair using your arms (e.g., wheelchair or bedside chair)?: A Little Help needed to walk in hospital room?: A Little Help needed climbing 3-5 steps with a railing? : A Little 6 Click Score: 18    End of Session Equipment Utilized During Treatment: Gait belt Activity Tolerance: Patient tolerated treatment well Patient left: in chair;with call bell/phone within reach;with chair alarm set;with SCD's reapplied Nurse Communication: Mobility status PT Visit Diagnosis: Other abnormalities of gait and mobility (R26.89);Difficulty in walking, not elsewhere classified (R26.2)    Time: 7510-2585 PT Time Calculation (min) (ACUTE ONLY): 19 min   Charges:   PT Evaluation $PT Eval Low Complexity: 1 Low         Astou Lada Conception Chancy, PT Acute Rehabilitation Services Pager [phone]  Office [phone]  Roxine Caddy D Elonda Husky 04/07/2019, 7:28 PM

## 2019-04-07 NOTE — Anesthesia Postprocedure Evaluation (Signed)
Anesthesia Post Note  Patient: Krista Brown  Procedure(s) Performed: TOTAL HIP ARTHROPLASTY (Left Hip)     Anesthesia Post Evaluation  Last Vitals:  Vitals:   04/07/19 1345 04/07/19 1500  BP: 132/70 (!) 154/75  Pulse: 76 81  Resp: 12 12  Temp: (!) 36.3 C   SpO2: 99% 98%    Last Pain:  Vitals:   04/07/19 1500  TempSrc:   PainSc: Asleep                 Aviella Disbrow

## 2019-04-07 NOTE — Transfer of Care (Signed)
Immediate Anesthesia Transfer of Care Note  Patient: Krista Brown  Procedure(s) Performed: TOTAL HIP ARTHROPLASTY (Left Hip)  Patient Location: PACU  Anesthesia Type:General  Level of Consciousness: awake, alert , oriented and patient cooperative  Airway & Oxygen Therapy: Patient Spontanous Breathing and Patient connected to face mask oxygen  Post-op Assessment: Report given to RN and Post -op Vital signs reviewed and stable  Post vital signs: Reviewed and stable  Last Vitals:  Vitals Value Taken Time  BP 137/72 04/07/19 1255  Temp    Pulse 82 04/07/19 1257  Resp 21 04/07/19 1257  SpO2 100 % 04/07/19 1257  Vitals shown include unvalidated device data.  Last Pain:  Vitals:   04/07/19 0752  TempSrc: Oral         Complications: No apparent anesthesia complications

## 2019-04-07 NOTE — Brief Op Note (Signed)
04/07/2019  12:52 PM  PATIENT:  Krista Brown  68 y.o. female  PRE-OPERATIVE DIAGNOSIS:  OA LEFT HIP  POST-OPERATIVE DIAGNOSIS:  OA LEFT HIP  PROCEDURE:  Procedure(s): TOTAL HIP ARTHROPLASTY (Left)  SURGEON:  Surgeon(s) and Role:    Earlie Server, MD - Primary  PHYSICIAN ASSISTANT: Chriss Czar, PA-C  ASSISTANTS: OR staff x1   ANESTHESIA:   local and general  EBL:  250 mL   BLOOD ADMINISTERED:none  DRAINS: none   LOCAL MEDICATIONS USED:  MARCAINE     SPECIMEN:  No Specimen  DISPOSITION OF SPECIMEN:  N/A  COUNTS:  YES  TOURNIQUET:  * No tourniquets in log *  DICTATION: .Other Dictation: Dictation Number unknown  PLAN OF CARE: Admit for overnight observation  PATIENT DISPOSITION:  PACU - hemodynamically stable.   Delay start of Pharmacological VTE agent (>24hrs) due to surgical blood loss or risk of bleeding: yes

## 2019-04-08 ENCOUNTER — Encounter (HOSPITAL_COMMUNITY): Payer: Self-pay | Admitting: Orthopedic Surgery

## 2019-04-08 DIAGNOSIS — M1612 Unilateral primary osteoarthritis, left hip: Secondary | ICD-10-CM | POA: Diagnosis not present

## 2019-04-08 DIAGNOSIS — E039 Hypothyroidism, unspecified: Secondary | ICD-10-CM | POA: Diagnosis present

## 2019-04-08 DIAGNOSIS — I10 Essential (primary) hypertension: Secondary | ICD-10-CM | POA: Diagnosis present

## 2019-04-08 LAB — CBC
HCT: 31.8 % — ABNORMAL LOW (ref 36.0–46.0)
Hemoglobin: 10.1 g/dL — ABNORMAL LOW (ref 12.0–15.0)
MCH: 30.4 pg (ref 26.0–34.0)
MCHC: 31.8 g/dL (ref 30.0–36.0)
MCV: 95.8 fL (ref 80.0–100.0)
Platelets: 248 10*3/uL (ref 150–400)
RBC: 3.32 MIL/uL — ABNORMAL LOW (ref 3.87–5.11)
RDW: 15.6 % — ABNORMAL HIGH (ref 11.5–15.5)
WBC: 13.8 10*3/uL — ABNORMAL HIGH (ref 4.0–10.5)
nRBC: 0 % (ref 0.0–0.2)

## 2019-04-08 LAB — BASIC METABOLIC PANEL
Anion gap: 5 (ref 5–15)
BUN: 10 mg/dL (ref 8–23)
CO2: 25 mmol/L (ref 22–32)
Calcium: 9.2 mg/dL (ref 8.9–10.3)
Chloride: 107 mmol/L (ref 98–111)
Creatinine, Ser: 0.5 mg/dL (ref 0.44–1.00)
GFR calc Af Amer: >60 mL/min (ref >60)
GFR calc non Af Amer: >60 mL/min (ref >60)
Glucose, Bld: 124 mg/dL — ABNORMAL HIGH (ref 70–99)
Potassium: 3.6 mmol/L (ref 3.5–5.1)
Sodium: 137 mmol/L (ref 135–145)

## 2019-04-08 MED ORDER — ACETAMINOPHEN 500 MG PO TABS: 500.0000 mg | ORAL_TABLET | Freq: Four times a day (QID) | ORAL | 0 refills | Status: AC | PRN

## 2019-04-08 NOTE — Plan of Care (Signed)
  Problem: Activity: Goal: Ability to avoid complications of mobility impairment will improve Outcome: Progressing   Problem: Clinical Measurements: Goal: Postoperative complications will be avoided or minimized Outcome: Progressing   Problem: Pain Management: Goal: Pain level will decrease with appropriate interventions Outcome: Progressing   Problem: Clinical Measurements: Goal: Ability to maintain clinical measurements within normal limits will improve Outcome: Progressing

## 2019-04-08 NOTE — TOC Initial Note (Signed)
Transition of Care [facility]) - Initial/Assessment Note    Patient Details  Name: Krista Brown MRN: [id number] Date of Birth: 09/14/1950  Transition of Care (TOC) CM/SW Contact:    Joaquin Courts, RN Phone Number: 04/08/2019, 11:47 AM  Clinical Narrative:    CM spoke with patient at bedside. Patient to follow up with [facility], has appointment on Monday. Patient will be staying with her mother who has a handicap accessible home and rolling walker and 3-in-1. Patient states she does not need DME.                Expected Discharge Plan: Home/Self Care     Patient Goals and CMS Choice Patient states their goals for this hospitalization and ongoing recovery are:: to go home with outpatient therapy      Expected Discharge Plan and Services Expected Discharge Plan: Home/Self Care   Discharge Planning Services: CM Consult   Living arrangements for the past 2 months: Single Family Home                 DME Arranged: N/A DME Agency: NA       HH Arranged: NA HH Agency: NA        Prior Living Arrangements/Services Living arrangements for the past 2 months: Single Family Home Lives with:: Adult Children, Parents Patient language and need for interpreter reviewed:: Yes Do you feel safe going back to the place where you live?: Yes      Need for Family Participation in Patient Care: Yes (Comment) Care giver support system in place?: Yes (comment)   Criminal Activity/Legal Involvement Pertinent to Current Situation/Hospitalization: No - Comment as needed  Activities of Daily Living Home Assistive Devices/Equipment: Eyeglasses, Raised toilet seat with rails(reading glasses) ADL Screening (condition at time of admission) Patient's cognitive ability adequate to safely complete daily activities?: Yes Is the patient deaf or have difficulty hearing?: No Does the patient have difficulty seeing, even when wearing glasses/contacts?: No Does the patient have difficulty concentrating,  remembering, or making decisions?: No Patient able to express need for assistance with ADLs?: No Does the patient have difficulty dressing or bathing?: No Independently performs ADLs?: Yes (appropriate for developmental age) Does the patient have difficulty walking or climbing stairs?: No Weakness of Legs: None Weakness of Arms/Hands: None  Permission Sought/Granted                  Emotional Assessment Appearance:: Appears stated age Attitude/Demeanor/Rapport: Engaged Affect (typically observed): Accepting Orientation: : Oriented to Place, Oriented to  Time, Oriented to Situation, Oriented to Self   Psych Involvement: No (comment)  Admission diagnosis:  OA LEFT HIP Patient Active Problem List   Diagnosis Date Noted  . Osteoarthritis of left hip 04/07/2019  . Chest pain 01/08/2011  . Unspecified essential hypertension 01/08/2011  . Hepatitis 01/08/2011   PCP:  Manon Hilding, MD Pharmacy:   [facility] [address] [place] [address] Phone: [phone] Fax: [phone]     Social Determinants of Health (SDOH) Interventions    Readmission Risk Interventions No flowsheet data found.

## 2019-04-08 NOTE — Care Management Obs Status (Signed)
MEDICARE OBSERVATION STATUS NOTIFICATION   Patient Details  Name: Krista Brown MRN: [id number] Date of Birth: [birth year]   Medicare Observation Status Notification Given:  Yes    Joaquin Courts, RN 04/08/2019, 1:34 PM

## 2019-04-08 NOTE — Progress Notes (Signed)
Orthopedic Trauma Service Progress Note weekend coverage   Patient ID: Krista Brown MRN: [id number] DOB/AGE: 68/09/1950 68 y.o.  Subjective:  Doing very well  Ready to go home  No specific concerns    Review of Systems  Constitutional: Negative for chills and fever.  Respiratory: Negative for shortness of breath and wheezing.   Cardiovascular: Negative for chest pain and palpitations.  Gastrointestinal: Negative for nausea and vomiting.  Neurological: Negative for tingling and sensory change.    Objective:   VITALS:   Vitals:   04/07/19 1958 04/08/19 0149 04/08/19 0644 04/08/19 1518  BP: 135/67 134/68 118/65 116/64  Pulse: 87 79 75 67  Resp: 16 15 17 15   Temp: 98.2 F (36.8 C) 98.4 F (36.9 C) 98.6 F (37 C) 98.4 F (36.9 C)  TempSrc: Oral Oral Oral Oral  SpO2: 100% 100% 93% 92%  Weight:      Height:        Estimated body mass index is 25.25 kg/m as calculated from the following:   Height as of this encounter: 4\' 11"  (1.499 m).   Weight as of this encounter: 56.7 kg.   Intake/Output      08/14 0701 - 08/15 0700 08/15 0701 - 08/16 0700   P.O. 840 1920   I.V. (mL/kg) 2971.9 (52.4) 382.1 (6.7)   IV Piggyback 600    Total Intake(mL/kg) 4411.9 (77.8) 2302.1 (40.6)   Urine (mL/kg/hr) 3600 550 (1)   Stool  0   Blood 250    Total Output 3850 550   Net +561.9 +1752.1        Urine Occurrence  3 x   Stool Occurrence  0 x     LABS  Results for orders placed or performed during the hospital encounter of 04/07/19 (from the past 24 hour(s))  CBC     Status: Abnormal   Collection Time: 04/08/19  2:57 AM  Result Value Ref Range   WBC 13.8 (H) 4.0 - 10.5 K/uL   RBC 3.32 (L) 3.87 - 5.11 MIL/uL   Hemoglobin 10.1 (L) 12.0 - 15.0 g/dL   HCT 40.931.8 (L) 81.136.0 - 91.446.0 %   MCV 95.8 80.0 - 100.0 fL   MCH 30.4 26.0 - 34.0 pg   MCHC 31.8 30.0 - 36.0 g/dL   RDW 78.215.6 (H) 95.611.5 - 21.315.5 %   Platelets 248 150  - 400 K/uL   nRBC 0.0 0.0 - 0.2 %  Basic metabolic panel     Status: Abnormal   Collection Time: 04/08/19  2:57 AM  Result Value Ref Range   Sodium 137 135 - 145 mmol/L   Potassium 3.6 3.5 - 5.1 mmol/L   Chloride 107 98 - 111 mmol/L   CO2 25 22 - 32 mmol/L   Glucose, Bld 124 (H) 70 - 99 mg/dL   BUN 10 8 - 23 mg/dL   Creatinine, Ser 0.860.50 0.44 - 1.00 mg/dL   Calcium 9.2 8.9 - 57.810.3 mg/dL   GFR calc non Af Amer >60 >60 mL/min   GFR calc Af Amer >60 >60 mL/min   Anion gap 5 5 - 15     PHYSICAL EXAM:   Gen: resting comfortably in bed, NAD, appears well, eating  Lungs: breathing unlabored Cardiac: regular  Abd: + BS, NTND Ext:  Left Lower Extremity   Dressing stable   Scant drainage noted  Ext warm   + DP pulse  No DCT  EHL, FHL, lesser toes, anterior tibialis, posterior tibialis, peroneals and gastrocsoleus complex motor functions are intact  DPN, SPN, TN sensory functions intact  + quad set   Minimal swelling  Assessment/Plan: 1 Day Post-Op   Principal Problem:   Osteoarthritis of left hip Active Problems:   Hypothyroidism   Essential hypertension, benign   Anti-infectives (From admission, onward)   Start     Dose/Rate Route Frequency Ordered Stop   04/07/19 2300  vancomycin (VANCOCIN) IVPB 1000 mg/200 mL premix     1,000 mg 200 mL/hr over 60 Minutes Intravenous Every 12 hours 04/07/19 1643 04/07/19 2340   04/07/19 1030  vancomycin (VANCOCIN) IVPB 1000 mg/200 mL premix     1,000 mg 200 mL/hr over 60 Minutes Intravenous  Once 04/07/19 1025 04/07/19 1150   04/07/19 0730  ceFAZolin (ANCEF) IVPB 2g/100 mL premix     2 g 200 mL/hr over 30 Minutes Intravenous On call to O.R. 04/07/19 4098 04/07/19 1050    .  POD/HD#: 12  68 year old female with end-stage DJD left hip  -End-stage DJD left hip s/p left total hip arthroplasty  Weight-bear as tolerated with walker  Posterior hip precautions  Dressing to be left in place until follow-up appointment but is okay  to be  Ice as needed for swelling and pain control  PT and OT  - Pain management:  Continue with current regimen  - ABL anemia/Hemodynamics  Stable  - Medical issues   Home meds  - DVT/PE prophylaxis:  ASA   - ID:   periop abx completed   - Activity:  Ambulate with assistance   - FEN/GI prophylaxis/Foley/Lines:  Reg diet   - Dispo:  Dc home today   Follow up with Dr. French Ana in 2 weeks      Jari Pigg, PA-C [phone] (C) 04/08/2019, 5:12 PM  [facility] [place] [address] [phone] Domingo Sep (F)

## 2019-04-08 NOTE — Progress Notes (Signed)
   04/08/19 1300  PT Visit Information  Last PT Received On 04/08/19 pt progressing well; pt verbalizes 3/3 THP. Amb incr distance and reviewed HEP  Assistance Needed +1  History of Present Illness 68 yo female s/p L THA posterior approach on 04/07/19. PMH includes HTN, macular degeneration, and OA.  Subjective Data  Patient Stated Goal go home tomorrow  Precautions  Precautions Fall;Posterior Hip  Precaution Comments reviewed THP, pt recalls 2/3 and follows functionally with min cues   Restrictions  Weight Bearing Restrictions No  LLE Weight Bearing WBAT  Pain Assessment  Pain Score 2  Pain Location L hip and glut  Pain Descriptors / Indicators Sore;Tightness  Pain Intervention(s) Monitored during session;Limited activity within patient's tolerance;Premedicated before session;Repositioned  Cognition  Arousal/Alertness Awake/alert  Behavior During Therapy WFL for tasks assessed/performed  Overall Cognitive Status Within Functional Limits for tasks assessed  Bed Mobility  General bed mobility comments in recliner  Transfers  Overall transfer level Needs assistance  Equipment used Rolling walker (2 wheeled)  Transfers Sit to/from Stand  Sit to Stand Supervision  General transfer comment cues for THP, hand placement--pt self cues end of session  Ambulation/Gait  Ambulation/Gait assistance Supervision  Gait Distance (Feet) 80 Feet  Assistive device Rolling walker (2 wheeled)  Gait Pattern/deviations Step-through pattern;Decreased stride length;Step-to pattern;Antalgic;Trunk flexed  General Gait Details cues for sequence and THP especially with turns  Gait velocity decr  Total Joint Exercises  Ankle Circles/Pumps AROM;Both;10 reps  Quad Sets AROM;Both;10 reps  Heel Slides AAROM;AROM;Left;10 reps  Hip ABduction/ADduction AROM;Strengthening;Left;10 reps  Short Arc Quad AROM;AAROM;Left;10 reps  PT - End of Session  Equipment Utilized During Treatment Gait belt  Activity Tolerance  Patient tolerated treatment well  Patient left in chair;with call bell/phone within reach;with chair alarm set  Nurse Communication Mobility status   PT - Assessment/Plan  PT Plan Current plan remains appropriate  PT Visit Diagnosis Other abnormalities of gait and mobility (R26.89);Difficulty in walking, not elsewhere classified (R26.2)  PT Frequency (ACUTE ONLY) 7X/week  Follow Up Recommendations Follow surgeon's recommendation for DC plan and follow-up therapies;Supervision for mobility/OOB  PT equipment Rolling walker with 5" wheels  AM-PAC PT "6 Clicks" Mobility Outcome Measure (Version 2)  Help needed turning from your back to your side while in a flat bed without using bedrails? 3  Help needed moving from lying on your back to sitting on the side of a flat bed without using bedrails? 3  Help needed moving to and from a bed to a chair (including a wheelchair)? 3  Help needed standing up from a chair using your arms (e.g., wheelchair or bedside chair)? 3  Help needed to walk in hospital room? 3  Help needed climbing 3-5 steps with a railing?  3  6 Click Score 18  Consider Recommendation of Discharge To: Home with [facility]  PT Goal Progression  Progress towards PT goals Progressing toward goals  Acute Rehab PT Goals  PT Goal Formulation With patient  Time For Goal Achievement 04/14/19  Potential to Achieve Goals Good  PT Time Calculation  PT Start Time (ACUTE ONLY) 1318  PT Stop Time (ACUTE ONLY) 1347  PT Time Calculation (min) (ACUTE ONLY) 29 min  PT General Charges  $$ ACUTE PT VISIT 1 Visit  PT Treatments  $Gait Training 23-37 mins

## 2019-04-08 NOTE — Progress Notes (Signed)
Pt stable at time of discharge. Pt to have rolling walker delivered to her home. No questions on d/c education and instructions given. Pt family at bedside prior to d/c.

## 2019-04-08 NOTE — Progress Notes (Signed)
Physical Therapy Treatment Patient Details Name: Krista Brown MRN: [id number] DOB: 02/17/1951 Today's Date: 04/08/2019    History of Present Illness 68 yo female s/p L THA posterior approach on 04/07/19. PMH includes HTN, macular degeneration, and OA.    PT Comments    Pt progressing well. Will see again in pm. Possibly will be able to d/c  Follow Up Recommendations  Follow surgeon's recommendation for DC plan and follow-up therapies;Supervision for mobility/OOB     Equipment Recommendations  Rolling walker with 5" wheels    Recommendations for Other Services       Precautions / Restrictions Precautions Precautions: Fall;Posterior Hip Precaution Comments: reviewed THP, pt recalls 2/3 and follows functionally with min cues  Restrictions Weight Bearing Restrictions: No LLE Weight Bearing: Weight bearing as tolerated    Mobility  Bed Mobility Overal bed mobility: Needs Assistance Bed Mobility: Supine to Sit     Supine to sit: Min guard     General bed mobility comments: cues for technique and THP, min/guard for safety  Transfers Overall transfer level: Needs assistance Equipment used: Rolling walker (2 wheeled) Transfers: Sit to/from Stand Sit to Stand: Min guard         General transfer comment: cues for THP, hand placement  Ambulation/Gait Ambulation/Gait assistance: Min guard Gait Distance (Feet): 75 Feet(10' more) Assistive device: Rolling walker (2 wheeled) Gait Pattern/deviations: Step-through pattern;Decreased stride length;Step-to pattern;Antalgic;Trunk flexed Gait velocity: decr   General Gait Details: cues for sequence and THP especially with turns   Social research officer, governmenttairs             Wheelchair Mobility    Modified Rankin (Stroke Patients Only)       Balance                                            Cognition Arousal/Alertness: Awake/alert Behavior During Therapy: WFL for tasks assessed/performed Overall Cognitive Status:  Within Functional Limits for tasks assessed                                        Exercises      General Comments        Pertinent Vitals/Pain Pain Score: 4  Pain Location: L hip Pain Descriptors / Indicators: Sore;Tightness Pain Intervention(s): Limited activity within patient's tolerance;Monitored during session;RN gave pain meds during session;Other (comment)(pt wanted to do PT prior to getting meds)    Home Living                      Prior Function            PT Goals (current goals can now be found in the care plan section) Acute Rehab PT Goals Patient Stated Goal: go home tomorrow PT Goal Formulation: With patient Time For Goal Achievement: 04/14/19 Potential to Achieve Goals: Good Progress towards PT goals: Progressing toward goals    Frequency    7X/week      PT Plan Current plan remains appropriate    Co-evaluation              AM-PAC PT "6 Clicks" Mobility   Outcome Measure  Help needed turning from your back to your side while in a flat bed without using bedrails?: A Little Help needed moving from lying on  your back to sitting on the side of a flat bed without using bedrails?: A Little Help needed moving to and from a bed to a chair (including a wheelchair)?: A Little Help needed standing up from a chair using your arms (e.g., wheelchair or bedside chair)?: A Little Help needed to walk in hospital room?: A Little Help needed climbing 3-5 steps with a railing? : A Little 6 Click Score: 18    End of Session Equipment Utilized During Treatment: Gait belt Activity Tolerance: Patient tolerated treatment well Patient left: in chair;with call bell/phone within reach;with chair alarm set Nurse Communication: Mobility status PT Visit Diagnosis: Other abnormalities of gait and mobility (R26.89);Difficulty in walking, not elsewhere classified (R26.2)     Time: 7262-0355 PT Time Calculation (min) (ACUTE ONLY): 23  min  Charges:  $Gait Training: 23-37 mins                     Kenyon Ana, PT  Pager: [phone] Acute Rehab Dept [facility]): [phone]   04/08/2019    [facility] 04/08/2019, 11:47 AM

## 2019-04-09 NOTE — Progress Notes (Signed)
CM received notice that patient is in need of rolling walker despite stating to CM yesterday that she had one available at home.  CM spoke with patient via telephone and confirmed patient needs Rolling walker, patient staying with her mother.  [employer] rep [employer] notified of need and will arrange to have walker shipped to patient's mother home at [place] [address].  CM informed patient of this and informed patient it would take 3 days for the walker to arrive which the patient is in agreement.

## 2019-04-10 ENCOUNTER — Encounter (HOSPITAL_COMMUNITY): Payer: Self-pay | Admitting: Orthopedic Surgery

## 2019-04-10 DIAGNOSIS — M25552 Pain in left hip: Secondary | ICD-10-CM | POA: Diagnosis not present

## 2019-04-10 DIAGNOSIS — M25511 Pain in right shoulder: Secondary | ICD-10-CM | POA: Diagnosis not present

## 2019-04-10 DIAGNOSIS — G8929 Other chronic pain: Secondary | ICD-10-CM | POA: Diagnosis not present

## 2019-04-14 DIAGNOSIS — M1612 Unilateral primary osteoarthritis, left hip: Secondary | ICD-10-CM | POA: Diagnosis not present

## 2019-04-17 DIAGNOSIS — M25552 Pain in left hip: Secondary | ICD-10-CM | POA: Diagnosis not present

## 2019-04-18 DIAGNOSIS — M1612 Unilateral primary osteoarthritis, left hip: Secondary | ICD-10-CM | POA: Diagnosis not present

## 2019-04-20 DIAGNOSIS — M25552 Pain in left hip: Secondary | ICD-10-CM | POA: Diagnosis not present

## 2019-04-21 DIAGNOSIS — M1612 Unilateral primary osteoarthritis, left hip: Secondary | ICD-10-CM | POA: Diagnosis not present

## 2019-04-24 DIAGNOSIS — M25552 Pain in left hip: Secondary | ICD-10-CM | POA: Diagnosis not present

## 2019-04-27 DIAGNOSIS — M25552 Pain in left hip: Secondary | ICD-10-CM | POA: Diagnosis not present

## 2019-05-02 DIAGNOSIS — M25552 Pain in left hip: Secondary | ICD-10-CM | POA: Diagnosis not present

## 2019-05-04 DIAGNOSIS — M25552 Pain in left hip: Secondary | ICD-10-CM | POA: Diagnosis not present

## 2019-05-04 NOTE — Discharge Summary (Signed)
PATIENT ID: Krista Brown        MRN:  [id number]          DOB/AGE: [birth year] / 68 y.o.    DISCHARGE SUMMARY  ADMISSION DATE:    04/07/2019 DISCHARGE DATE:   04/08/2019  ADMISSION DIAGNOSIS: OA LEFT HIP    DISCHARGE DIAGNOSIS:  OA LEFT HIP    ADDITIONAL DIAGNOSIS: Principal Problem:   Osteoarthritis of left hip Active Problems:   Hypothyroidism   Essential hypertension, benign  Past Medical History:  Diagnosis Date  . Abnormal liver function tests    Hepatic cyst  . Chest pain    Normal coronary arteries 4/12  . Essential hypertension, benign   . Hypothyroidism   . Macular degeneration   . Osteoarthritis    hands  . PONV (postoperative nausea and vomiting)     PROCEDURE: Procedure(s): TOTAL HIP ARTHROPLASTY Left on 04/07/2019  CONSULTS: PT    HISTORY:  See H&P in chart  HOSPITAL COURSE:  Krista Brown is a 68 y.o. admitted on 04/07/2019 and found to have a diagnosis of OA LEFT HIP.  After appropriate laboratory studies were obtained  they were taken to the operating room on 04/07/2019 and underwent  Procedure(s): TOTAL HIP ARTHROPLASTY  Left.   They were given perioperative antibiotics:  Anti-infectives (From admission, onward)   Start     Dose/Rate Route Frequency Ordered Stop   04/07/19 2300  vancomycin (VANCOCIN) IVPB 1000 mg/200 mL premix     1,000 mg 200 mL/hr over 60 Minutes Intravenous Every 12 hours 04/07/19 1643 04/07/19 2340   04/07/19 1030  vancomycin (VANCOCIN) IVPB 1000 mg/200 mL premix     1,000 mg 200 mL/hr over 60 Minutes Intravenous  Once 04/07/19 1025 04/07/19 1150   04/07/19 0730  ceFAZolin (ANCEF) IVPB 2g/100 mL premix     2 g 200 mL/hr over 30 Minutes Intravenous On call to O.R. 04/07/19 9528 04/07/19 1050    .  Tolerated the procedure well.  Placed with a foley intraoperatively.   POD #1, allowed out of bed to a chair.  PT for ambulation and exercise program.  Foley D/C'd in morning.  IV saline locked.  O2 discontionued.  The remainder of  the hospital course was dedicated to ambulation and strengthening.   The patient was discharged on 1 day post op in  Stable condition.  Blood products given:none  DIAGNOSTIC STUDIES: Recent vital signs: No data found.     Recent laboratory studies: No results for input(s): WBC, HGB, HCT, PLT in the last 168 hours. No results for input(s): NA, K, CL, CO2, BUN, CREATININE, GLUCOSE, CALCIUM in the last 168 hours. Lab Results  Component Value Date   INR 0.9 03/27/2019   INR 1.01 12/21/2010     Recent Radiographic Studies :  Dg Pelvis Portable  Result Date: 04/07/2019 CLINICAL DATA:  Postop left hip. EXAM: PORTABLE PELVIS 1-2 VIEWS COMPARISON:  None. FINDINGS: Patient is status post left hip replacement. Hardware is in good position. Postoperative air is identified. The patient also has a right hip replacement, in good position. IMPRESSION: Status post left hip replacement as above. Electronically Signed   By: Dorise Bullion III M.D   On: 04/07/2019 13:15   Dg Hip Port Unilat With Pelvis 1v Left  Result Date: 04/07/2019 CLINICAL DATA:  Status post left hip replacement. EXAM: DG HIP (WITH OR WITHOUT PELVIS) 1V PORT LEFT COMPARISON:  Radiographs of July 14, 2018. FINDINGS: The left femoral and acetabular components  are well situated. Expected postoperative changes are seen in the surrounding soft tissues. No fracture or dislocation is noted. IMPRESSION: Status post left total hip arthroplasty. Electronically Signed   By: Lupita RaiderJames  Green Jr M.D.   On: 04/07/2019 13:17    DISCHARGE INSTRUCTIONS:   DISCHARGE MEDICATIONS:   Allergies as of 04/08/2019   No Known Allergies     Medication List    TAKE these medications   acetaminophen 500 MG tablet Commonly known as: TYLENOL Take 1,500 mg by mouth 2 (two) times daily as needed for moderate pain or headache. What changed: Another medication with the same name was added. Make sure you understand how and when to take each.   acetaminophen  500 MG tablet Commonly known as: TYLENOL Take 1-2 tablets (500-1,000 mg total) by mouth every 6 (six) hours as needed for mild pain, moderate pain or fever. What changed: You were already taking a medication with the same name, and this prescription was added. Make sure you understand how and when to take each.   amLODipine 10 MG tablet Commonly known as: NORVASC Take 10 mg by mouth daily.   aspirin EC 81 MG tablet Take 1 tablet (81 mg total) by mouth 2 (two) times daily. TO PREVENT BLOOD CLOTS   clonazePAM 0.5 MG tablet Commonly known as: KLONOPIN Take 0.5 mg by mouth every 8 (eight) hours as needed for anxiety.   Coenzyme Q10 100 MG capsule Take 100 mg by mouth daily.   fish oil-omega-3 fatty acids 1000 MG capsule Take 1 g by mouth 2 (two) times daily.   levothyroxine 75 MCG tablet Commonly known as: SYNTHROID Take 75 mcg by mouth daily before breakfast.   oxyCODONE 5 MG immediate release tablet Commonly known as: Oxy IR/ROXICODONE Take one tab po q4-6hrs prn pain, may need 1-2 first couple weeks   PreserVision AREDS 2 Caps Take 1 capsule by mouth 2 (two) times a day.   Turmeric 500 MG Tabs Take 750 mg by mouth daily.   vitamin C 500 MG tablet Commonly known as: ASCORBIC ACID Take 500 mg by mouth daily.   Vitamin D3 25 MCG (1000 UT) Caps Take 1,000 Units by mouth daily.       FOLLOW UP VISIT:   Follow-up Information    Frederico Hammanaffrey, Daniel, MD. Schedule an appointment as soon as possible for a visit in 2 weeks.   Specialty: Orthopedic Surgery Contact information: [address] [place] [address] [phone]           DISPOSITION:   Home  CONDITION:  Stable   Margart SicklesJoshua Zoey Bidwell, PA-C  05/04/2019 1:55 PM

## 2019-05-08 DIAGNOSIS — M25552 Pain in left hip: Secondary | ICD-10-CM | POA: Diagnosis not present

## 2019-05-11 DIAGNOSIS — M25552 Pain in left hip: Secondary | ICD-10-CM | POA: Diagnosis not present

## 2019-05-15 DIAGNOSIS — M25552 Pain in left hip: Secondary | ICD-10-CM | POA: Diagnosis not present

## 2019-05-18 DIAGNOSIS — M25552 Pain in left hip: Secondary | ICD-10-CM | POA: Diagnosis not present

## 2019-05-22 DIAGNOSIS — M25552 Pain in left hip: Secondary | ICD-10-CM | POA: Diagnosis not present

## 2019-05-26 DIAGNOSIS — M1612 Unilateral primary osteoarthritis, left hip: Secondary | ICD-10-CM | POA: Diagnosis not present

## 2019-07-24 DIAGNOSIS — R69 Illness, unspecified: Secondary | ICD-10-CM | POA: Diagnosis not present

## 2019-07-24 DIAGNOSIS — E7800 Pure hypercholesterolemia, unspecified: Secondary | ICD-10-CM | POA: Diagnosis not present

## 2019-07-24 DIAGNOSIS — E782 Mixed hyperlipidemia: Secondary | ICD-10-CM | POA: Diagnosis not present

## 2019-07-24 DIAGNOSIS — I10 Essential (primary) hypertension: Secondary | ICD-10-CM | POA: Diagnosis not present

## 2019-07-24 DIAGNOSIS — E039 Hypothyroidism, unspecified: Secondary | ICD-10-CM | POA: Diagnosis not present

## 2019-07-28 DIAGNOSIS — Z6826 Body mass index (BMI) 26.0-26.9, adult: Secondary | ICD-10-CM | POA: Diagnosis not present

## 2019-07-28 DIAGNOSIS — Z1389 Encounter for screening for other disorder: Secondary | ICD-10-CM | POA: Diagnosis not present

## 2019-07-28 DIAGNOSIS — I10 Essential (primary) hypertension: Secondary | ICD-10-CM | POA: Diagnosis not present

## 2019-07-28 DIAGNOSIS — R69 Illness, unspecified: Secondary | ICD-10-CM | POA: Diagnosis not present

## 2019-07-28 DIAGNOSIS — Z1331 Encounter for screening for depression: Secondary | ICD-10-CM | POA: Diagnosis not present

## 2019-07-28 DIAGNOSIS — F320 Major depressive disorder, single episode, mild: Secondary | ICD-10-CM | POA: Diagnosis not present

## 2019-07-28 DIAGNOSIS — Z23 Encounter for immunization: Secondary | ICD-10-CM | POA: Diagnosis not present

## 2019-09-01 DIAGNOSIS — M1612 Unilateral primary osteoarthritis, left hip: Secondary | ICD-10-CM | POA: Diagnosis not present

## 2019-09-05 DIAGNOSIS — H16221 Keratoconjunctivitis sicca, not specified as Sjogren's, right eye: Secondary | ICD-10-CM | POA: Diagnosis not present

## 2019-09-05 DIAGNOSIS — H353131 Nonexudative age-related macular degeneration, bilateral, early dry stage: Secondary | ICD-10-CM | POA: Diagnosis not present

## 2019-09-05 DIAGNOSIS — Z961 Presence of intraocular lens: Secondary | ICD-10-CM | POA: Diagnosis not present

## 2020-04-10 IMAGING — DX DG HIP (WITH OR WITHOUT PELVIS) 1V PORT LEFT
1 series · 1 of 1 positions shown · non-contrast
Comparison: Radiographs July 14, 2018.

CLINICAL DATA: Status post left hip replacement.

EXAM:
DG HIP (WITH OR WITHOUT PELVIS) 1V PORT LEFT

[hip ap]
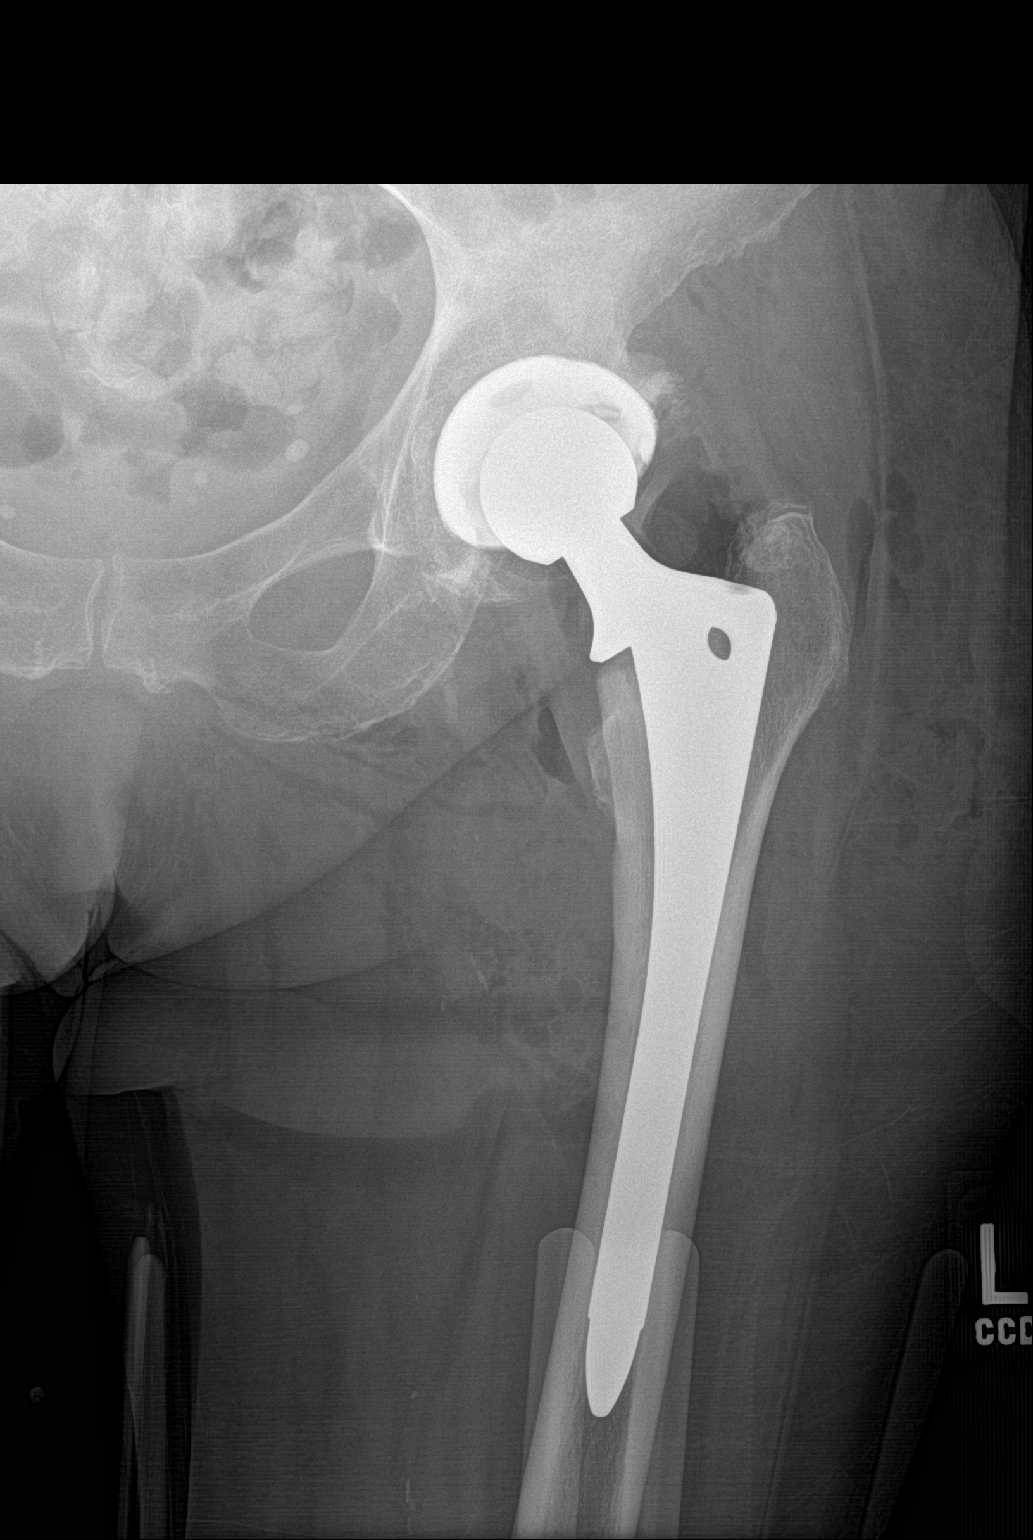

[1 of 1 positions shown; findings below may reference images not displayed]

FINDINGS: The left femoral and acetabular components are well situated.
Expected postoperative changes are seen in the surrounding soft
tissues. No fracture or dislocation is noted.
IMPRESSION: Status post left total hip arthroplasty.

## 2020-04-10 IMAGING — DX PORTABLE PELVIS 1-2 VIEWS
1 series · 1 of 1 positions shown · non-contrast
Comparison: None.

CLINICAL DATA: Postop left hip.

EXAM:
PORTABLE PELVIS 1-2 VIEWS

[pelvis ap]
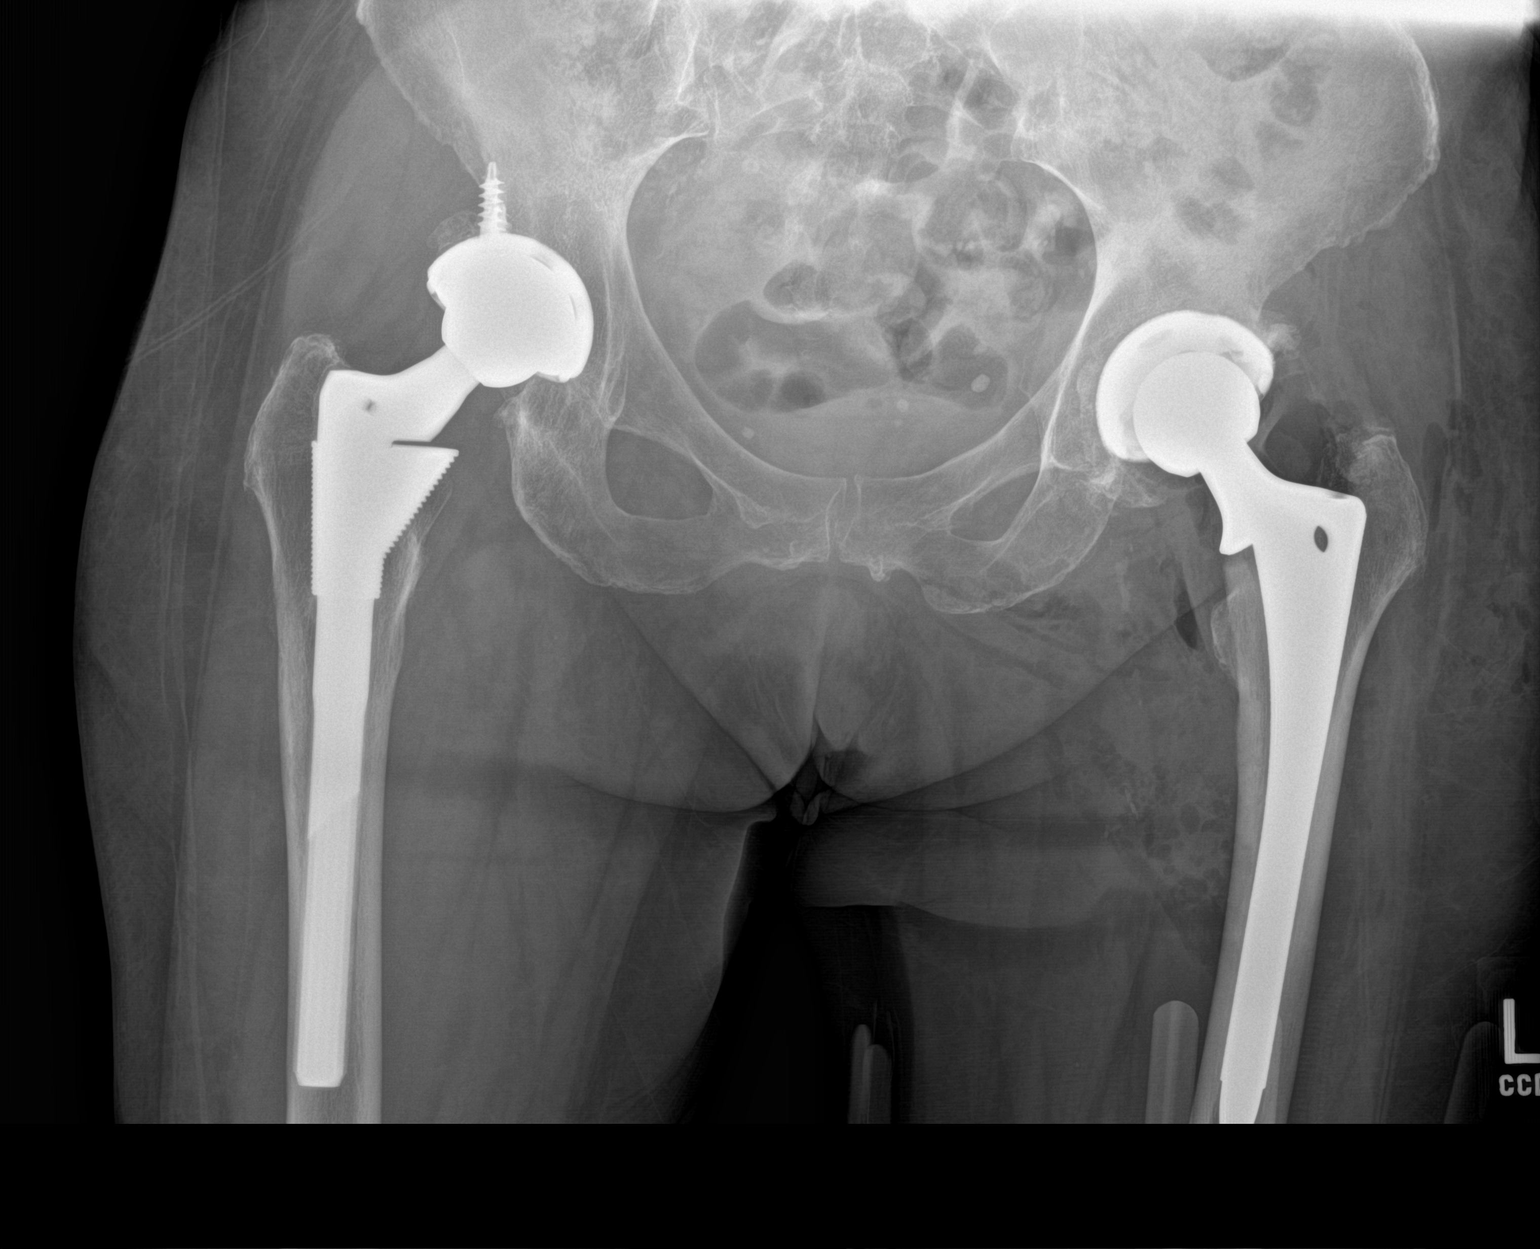

[1 of 1 positions shown; findings below may reference images not displayed]

FINDINGS: Patient is status post left hip replacement. Hardware is in good
position. Postoperative air is identified. The patient also has a
right hip replacement, in good position.
IMPRESSION: Status post left hip replacement as above.
